# Patient Record
Sex: Female | Born: 1950 | Race: White | Hispanic: No | Marital: Single | Smoking: Never smoker
Health system: Western US, Academic
[De-identification: ages and names within clinical notes are randomized; demographics above are authoritative.]

## PROBLEM LIST (undated history)

## (undated) ENCOUNTER — Encounter (HOSPITAL_BASED_OUTPATIENT_CLINIC_OR_DEPARTMENT_OTHER): Payer: Self-pay

## (undated) ENCOUNTER — Ambulatory Visit (HOSPITAL_BASED_OUTPATIENT_CLINIC_OR_DEPARTMENT_OTHER): Payer: Commercial Managed Care - PPO | Admitting: Gastroenterology

## (undated) DIAGNOSIS — I10 Essential (primary) hypertension: Secondary | ICD-10-CM

## (undated) DIAGNOSIS — K5190 Ulcerative colitis, unspecified, without complications: Principal | ICD-10-CM

## (undated) DIAGNOSIS — F329 Major depressive disorder, single episode, unspecified: Secondary | ICD-10-CM

## (undated) HISTORY — DX: Major depressive disorder, single episode, unspecified: F32.9

## (undated) HISTORY — DX: Ulcerative colitis, unspecified, without complications (CMS-HCC): K51.90

## (undated) HISTORY — DX: Essential (primary) hypertension: I10

## (undated) HISTORY — PX: NO PAST SURGICAL HISTORY: U1045

## (undated) SURGERY — COLONOSCOPY
Anesthesia: Moderate Sedation - by non-anesthesia staff only

---

## 2011-05-19 ENCOUNTER — Encounter (INDEPENDENT_AMBULATORY_CARE_PROVIDER_SITE_OTHER): Payer: Self-pay | Admitting: Gastroenterology

## 2011-05-19 ENCOUNTER — Ambulatory Visit (INDEPENDENT_AMBULATORY_CARE_PROVIDER_SITE_OTHER): Payer: No Typology Code available for payment source | Admitting: Gastroenterology

## 2011-05-19 VITALS — BP 137/78 | HR 80 | Temp 98.8°F | Resp 18 | Ht 64.0 in | Wt 152.0 lb

## 2011-05-19 DIAGNOSIS — F32A Depression, unspecified: Secondary | ICD-10-CM

## 2011-05-19 DIAGNOSIS — I10 Essential (primary) hypertension: Secondary | ICD-10-CM

## 2011-05-19 DIAGNOSIS — K5190 Ulcerative colitis, unspecified, without complications: Secondary | ICD-10-CM

## 2011-05-19 HISTORY — DX: Depression, unspecified: F32.A

## 2011-05-19 HISTORY — DX: Ulcerative colitis, unspecified, without complications (CMS-HCC): K51.90

## 2011-05-19 HISTORY — DX: Essential (primary) hypertension: I10

## 2011-05-19 MED ORDER — MESALAMINE 800 MG PO TBEC
4.0000 | DELAYED_RELEASE_TABLET | Freq: Every day | ORAL | Status: DC
Start: 2011-05-19 — End: 2011-05-19

## 2011-05-19 MED ORDER — MESALAMINE 1000 MG RE SUPP
1000.0000 mg | Freq: Every day | RECTAL | Status: DC
Start: 2011-05-19 — End: 2013-10-09

## 2011-05-19 MED ORDER — MESALAMINE 1000 MG RE SUPP
1000.00 mg | Freq: Every evening | RECTAL | Status: DC
Start: ? — End: 2013-01-09

## 2011-05-19 MED ORDER — VENLAFAXINE HCL 75 MG OR TABS: 75.00 mg | ORAL_TABLET | Freq: Every day | ORAL | Status: AC

## 2011-05-19 MED ORDER — MESALAMINE 800 MG PO TBEC
4.0000 | DELAYED_RELEASE_TABLET | Freq: Every day | ORAL | Status: DC
Start: 2011-05-19 — End: 2012-06-04

## 2011-05-19 MED ORDER — MESALAMINE 1000 MG RE SUPP
1000.0000 mg | Freq: Every day | RECTAL | Status: DC
Start: 2011-05-19 — End: 2011-05-19

## 2011-05-19 MED ORDER — OLMESARTAN MEDOXOMIL 5 MG OR TABS: 5.00 mg | ORAL_TABLET | Freq: Every day | ORAL | Status: AC

## 2011-05-19 MED ORDER — MESALAMINE 800 MG PO TBEC: 800.00 mg | DELAYED_RELEASE_TABLET | ORAL | Status: AC

## 2011-05-19 NOTE — Progress Notes (Signed)
[facility]  New Patient Visit  Visit practitioner:  Estill Bamberg    Date / Time: 05/19/2011 12:12 PM    Referring Provider: No ref. provider found     Reason for Visit: ulcerative colitis    Type of Visit: New patient    History of Present Illness:   Erica Sparks is a 60 year old female see problem list for details.    Patient Active Problem List   Diagnoses Date Noted   . Ulcerative colitis 05/19/2011     1995- diagnosed with ulcerative colitis with bloody diarrhea, dx made on flex sig/colonoscopy, treated with 5ASAs  Imuran 2001-2004  Remicade 2003-May 2010, did well. Stopped because didn't want to continue long term (time consuming, expensive)  May 2010- colonoscopy transverse colon inflammation  2010-2012- did well overall  August 2011- TPMT activity normal  08/16/10 [facility]- was on Asacol 4.8 or 3.6g/d tapered to 2.4g/d and intermittent Canasa. Also was having constipation  April 2012 - colonoscopy - mild proctitis, small polyps in cecum and rectum. Path ??  05/19/11 [facility] - occasional constipation. Typically 0-1 bm/d. Last couple of weeks does feel some incomplete evacuation. No blood. No straining. Gained some weight. Takes Asacol HD 3.2 g/d and Canasa most days. No rashes. Joint pains: L knee. ?Fibromyalgia. No eye problems.     Marland Kitchen HTN (hypertension) 05/19/2011   . Depression 05/19/2011     Not active.          Past Medical History   Diagnosis Date   . Ulcerative colitis 05/19/2011   . HTN (hypertension) 05/19/2011   . Depression 05/19/2011       Past Surgical History   Procedure Date   . No past surgical history        Current Medications:    Mesalamine (ASACOL HD) 800 MG TBEC Take 800 mg by mouth. Four tabs BID   mesalamine (CANASA) 1000 MG suppository Insert 1,000 mg rectally nightly.   olmesartan (BENICAR) 5 MG tablet Take 5 mg by mouth daily.   venlafaxine (EFFEXOR) 75 MG tablet Take 75 mg by mouth daily.       Allergies:  No Known Allergies    Family  History:  Family History   Problem Relation Age of Onset   . GI Neg Hx      no family history of IBD or colon cancer       History     Social History   . Marital Status: Single     Spouse Name: N/A     Number of Children: N/A   . 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 Smoking status: Never Smoker    . Smokeless tobacco: Never Used   . Alcohol Use: Yes      bottle wine per week (spread out 2 glasses per sitting)   . Drug Use: No   . Sexually Active: Not on file     Other Topics Concern   . Not on file     Social History Narrative   . No narrative on file       Review of Systems:  Weight loss:no  Trouble breathing: no  Chest pain: no  Abdominal pain: no  Nausea / vomitting: no  Kidney problems: no  Back pain: no  Seizures: no  Diabetes: no  Thyroid problems: no  Skin rash: no      Physical examination:   BP 137/78  Pulse 80  Temp(Src) 98.8 F (37.1 C) (Oral)  Resp 18  Ht 5\' 4"  (1.626 m)  Wt 68.947 kg (152 lb)  BMI 26.09 kg/m2 Body mass index is 26.09 kg/(m^2).  Wt Readings from Last 2 Encounters:   05/19/11 68.947 kg (152 lb)    Blood Pressure   05/19/11 137/78      Pain Score: 0  General:  Well developed, well nourished in no apparent distress.  Affect:  Normal  HEENT:  No scleral icterus. No red eyes. Trachea midline, mucus membranes moist. No oral ulcers. No cervical lymphadenopathy.  Lungs:  Clear to auscultation bilaterally. Normal respiratory effort.  Cardiovascular:  Regular rate and rhythm without murmurs, rubs or gallops.  Abdomen:  Abdomen soft, non tender, bowel sound present.  Extremities:  No peripheral edema. Warm well-perfused.  Skin:  Non-icteric and no visible rash  Neuro:  Cranial nerves II-XII, sensation, all motor strength grossly intact.  Psych:  Alert oriented X3, No anxiety or depression. Normal affect.    Lab Results  None    Images:  None    Impression: 60 yo woman with ulcerative colitis uncertain extent but  possibly pancolitis previously on Remicade and Imuran now on 5ASAs alone. Last colonoscopy with proctitis and polyps but unknown path. Pt now with incomplete evacuation of bowel movements.    1. Ulcerative colitis (556.9)  FLEX SIG PROCEDURE       Plan:   1) Continue Asacol HD and Canasa for now  2) Trial of Citrucel fiber supplement daily  3) Flexible sigmoidoscopy to assess that proctitis is healed  4) Obtain records from [facility] for colonoscopy from April 2012 along with pathology report  5) Return to clinic after above testing is completed.  6) Needs Cr checked every 6 mos  7) Baseline labs today- CBC with diff, LFT, chemistry, ESR, CRP, 25-OH Vitamin D  8) Surveillance colonoscopy every 1-2 years    Olsalazine, balsalazide, mesalamine toxicity: headache, rash, alopecia, interstitial nephritis, pericarditis, pneumonitis, hepatitis, pancreatitis, worsening diarrhea and abdominal pain.    The patient was discussed with and seen by Dr. [name]  Electronically Signed by:  Estill Bamberg  Gastroenterology   05/19/2011 12:12 PM

## 2011-05-19 NOTE — Patient Instructions (Signed)
1. Take citrucel daily (fiber supplement)  2. Schedule flexible sigmoidoscopy  3. Continue Asacol HD and Canasa suppositories    Side effects of medications you take:    Asacol HD/Canasa toxicity: headache, rash, alopecia, interstitial nephritis, pericarditis, pneumonitis, hepatitis, pancreatitis, worsening diarrhea and abdominal pain.     Support groups: [email or web address] [email or web address] , IBDSF [email or web address]    Dr. Pascal Lux book "IBD Self-Management: The AGA Guide to Crohn's Disease and Ulcerative Colitis" by Dr. Titus Dubin

## 2011-05-19 NOTE — Progress Notes (Signed)
Attending Note:  Cc: ulcerative colitis, constipation    Subjective:  I reviewed the history.  Patient interviewed and examined.  Pertinent notes from History of present illness (HPI) include  HPI:   Extensive uc since 1995. HPI noted as per problem list today.  Now, some feeling of incomplete evacuation. No bleeding. No diarrhea. Some joint aches, (knee, elbow, rib)  Recent blood tests at [facility] , normal cbc, creatinine. Vitamin d being monitored. Has osteopenia, being monitored.  Had flu shot this year.    Review of Systems (ROS): As per the fellow's note.  Past Medical, Family, Social History:  As per the fellow's  note.    Objective:   I have examined the patient and I concur with the fellow's exam.  Abnormalities on exam:   NAD  Lymph: no cervical LAD  Ab: soft, nt/nd  Assessment and plan reviewed with the fellow. I agree with the fellow's plan as documented.    Impression/Report/Plan:    Impression: :   1. Ulcerative colitis, (extensive) ,  Treated with 3.2 g/day 5asa + nightly 5-asa suppository; creatinine normal (per hx)  - recent symptoms of constipation may be functional vs due to active procitis  - rule out inflammation and check for healing w/ flexible sigmoidoscopy. If active, increase 5-asa. If severe/non response in future , could consider humira  2. History of polyps at last colonoscopy: obtain outside records from pathology at [facility]  - repeat surveillaince colonoscopy between about may 2013 and April 2014; (1-2 years post last colonoscopy), sooner pending biopsy results  3. Health care maintenance: up to date on flu shot, vitamin d and osteopenia being monitored    Risks of therapy discussed included interstial nephritis, and low blood counts    Recommendations:  1. Continue asacol 3.2g/day and canasa daily  2. Flexible sigmoidoscopy  3. Surveillance colonoscopy 1-2 years post last colonoscopy, sooner pending path report  4. Obtain outside records (creatinine, colonoscopy)    See the  fellow's note for further details.    Shayden Gingrich G. Jameia Makris MD  Division of Gastroenterology     counseling (preventative care, parodoxycal constipation of proctititis, 5-asa options and risks)  (25 minutes counseling)

## 2011-05-20 ENCOUNTER — Encounter (INDEPENDENT_AMBULATORY_CARE_PROVIDER_SITE_OTHER): Payer: Self-pay | Admitting: Gastroenterology

## 2011-05-20 NOTE — Progress Notes (Signed)
Encounter opened to scan documents.

## 2011-05-24 ENCOUNTER — Encounter (INDEPENDENT_AMBULATORY_CARE_PROVIDER_SITE_OTHER): Payer: Self-pay | Admitting: Gastroenterology

## 2012-06-04 ENCOUNTER — Other Ambulatory Visit (INDEPENDENT_AMBULATORY_CARE_PROVIDER_SITE_OTHER): Payer: Self-pay | Admitting: Gastroenterology

## 2012-06-04 MED ORDER — MESALAMINE 800 MG PO TBEC
3200.0000 mg | DELAYED_RELEASE_TABLET | Freq: Every day | ORAL | Status: DC
Start: 2012-06-04 — End: 2012-07-16

## 2012-06-04 NOTE — Telephone Encounter (Signed)
Needs creatinine and cbc performed to refill beyond 1 time.     Please ask her to schedule followup visit and flexible sigmoidoscopy which have been ordered in past and obtain labs.

## 2012-06-04 NOTE — Telephone Encounter (Signed)
Patient returning nurse call

## 2012-06-04 NOTE — Telephone Encounter (Signed)
Received refill from [facility] for patients asacol HD.     Last seen 05/19/11  Recommendations:   1. Continue asacol 3.2g/day and canasa daily   2. Flexible sigmoidoscopy   3. Surveillance colonoscopy 1-2 years post last colonoscopy, sooner pending path report    Patient did not get flex sig here at [place].

## 2012-06-04 NOTE — Telephone Encounter (Signed)
Called pt to notify her that med was refilled x1 and that she needs labs done prior to subsequent refills.   Will have schedulers contact pt for clinic visit and procedure scheduling.     Message was left for pt.

## 2012-06-06 NOTE — Telephone Encounter (Signed)
Called pt.   When she calls back, please schedule her for a clinic visit and flex sig.   Please also let her know that she needs blood work done before her medications can be refilled again (this was filled x1).     Left detailed message for pt.

## 2012-06-15 ENCOUNTER — Telehealth (INDEPENDENT_AMBULATORY_CARE_PROVIDER_SITE_OTHER): Payer: Self-pay | Admitting: Gastroenterology

## 2012-06-15 ENCOUNTER — Other Ambulatory Visit: Payer: PPO | Attending: Gastroenterology

## 2012-06-15 DIAGNOSIS — K5190 Ulcerative colitis, unspecified, without complications: Secondary | ICD-10-CM | POA: Insufficient documentation

## 2012-06-15 LAB — CBC WITH DIFF, BLOOD
ANC-Automated: 3.4 10*3/uL (ref 1.6–7.0)
Abs Basophils: 0.1 10*3/uL (ref 0.0–0.1)
Abs Eosinophils: 0.1 10*3/uL (ref 0.0–0.5)
Abs Lymphs: 2 10*3/uL (ref 0.8–3.1)
Abs Monos: 0.6 10*3/uL (ref 0.2–0.8)
Basophils: 1 % (ref 0–2)
Eosinophils: 1 % (ref 1–7)
Hct: 38 % (ref 34.0–45.0)
Hgb: 12.8 gm/dL (ref 11.2–15.7)
Lymphocytes: 33 % (ref 19–53)
MCH: 30.2 pg (ref 26.0–32.0)
MCHC: 33.7 % (ref 32.0–36.0)
MCV: 89.6 um3 (ref 79.0–95.0)
MPV: 10.5 fL (ref 9.4–12.4)
Monocytes: 9 % (ref 5–12)
Plt Count: 213 10*3/uL (ref 140–370)
RBC: 4.24 10*6/uL (ref 3.90–5.20)
RDW: 12.6 % (ref 12.0–14.0)
Segs: 56 % (ref 34–71)
WBC: 6.1 10*3/uL (ref 4.0–10.0)

## 2012-06-15 LAB — CREATININE W/GFR: Creatinine: 0.62 mg/dL (ref 0.51–0.95)

## 2012-06-15 LAB — GFR: GFR: >60 mL/min

## 2012-06-15 NOTE — Telephone Encounter (Signed)
Called patient to notify that no PA forms have been received from pharmacy. Last refill was 12/16.  Left VM for patient to return my call.   If I am unavailable please get information form patient.   I need to know ID number  Phone number where I need to call.     Thank you.

## 2012-06-15 NOTE — Telephone Encounter (Signed)
The patient would like a call back with the status of the authorization for her Asacol medication. The patient is very anxious, please call her before the end of the day.

## 2012-06-27 NOTE — Telephone Encounter (Signed)
Called patient to follow up.  She has received her medication, it was a mess up the pharmacy part.     Patient also inquired about her labs that were recently done.   Confirmed with Erica Sparks that labs are normal and relayed information to patient.

## 2012-07-16 ENCOUNTER — Ambulatory Visit (INDEPENDENT_AMBULATORY_CARE_PROVIDER_SITE_OTHER): Payer: PPO | Admitting: Gastroenterology

## 2012-07-16 VITALS — BP 125/80 | HR 81 | Temp 98.0°F | Resp 16 | Ht 64.0 in | Wt 152.0 lb

## 2012-07-16 DIAGNOSIS — K5190 Ulcerative colitis, unspecified, without complications: Secondary | ICD-10-CM

## 2012-07-16 MED ORDER — MESALAMINE 800 MG PO TBEC
3200.0000 mg | DELAYED_RELEASE_TABLET | Freq: Every day | ORAL | Status: DC
Start: 2012-07-16 — End: 2013-01-09

## 2012-07-16 MED ORDER — SOD PICOSULFATE-MAG OX-CIT ACD 10-3.5-12 MG-GM-GM PO PACK
1.00 | PACK | Freq: Once | ORAL | Status: AC
Start: 2012-07-16 — End: 2012-07-16

## 2012-07-16 NOTE — Progress Notes (Signed)
[facility]    Visit practitioner:  Aliah Eriksson G. [name]    Date / Time: 07/16/2012 8:14 AM    Referring Provider: Self, Referred     Reason for Visit/Chief Complaint: ulcerative colitis     Type of Visit: Follow up    History of Present Illness:   Erica Sparks is a 62 year old female With ulcerative colitis  since 1995    Currently taking:    mesalamine (ASACOL HD) 800 MG TBEC EC tablet Take 4 tablets by mouth daily.   Mesalamine (ASACOL HD) 800 MG TBEC Take 800 mg by mouth. Four tabs BID   mesalamine (CANASA) 1000 MG suppository Insert 1,000 mg rectally nightly.   mesalamine (CANASA) 1000 MG suppository Insert 1 suppository rectally daily.   olmesartan (BENICAR) 5 MG tablet Take 5 mg by mouth daily.   venlafaxine (EFFEXOR) 75 MG tablet Take 75 mg by mouth daily.         Bowel movements per day, 0/1 blood:  no   Vomitting: no  NSAID use: no  Antibiotics:  no  Fever: no  Weight loss: stable ,   ,   Sub par energy       No extraintestinal manifestations of Inflammatory bowel disease    Patient Active Problem List    Diagnosis Date Noted   . Ulcerative colitis 05/19/2011     1995- diagnosed with ulcerative colitis with bloody diarrhea, dx made on flex sig/colonoscopy, treated with 5ASAs  Imuran 2001-2004  Remicade 2003-May 2010, did well. Stopped because didn't want to continue long term (time consuming, expensive)  May 2010- colonoscopy transverse colon inflammation  2010-2012- did well overall  August 2011- TPMT activity normal  08/16/10 [facility]- was on Asacol 4.8 or 3.6g/d tapered to 2.4g/d and intermittent Canasa. Also was having constipation  April 2012 - colonoscopy - mild proctitis, small polyps in cecum and rectum. Path ??  05/19/11 [facility] Penta [place] [facility] - occasional constipation. Typically 0-1 bm/d. Last couple of weeks does feel some incomplete evacuation. No blood. No straining. Gained some weight. Takes Asacol HD 3.2 g/d and Canasa most days. No rashes. Joint pains: L  knee. ?Fibromyalgia. No eye problems.  1/27/14[name] IBD clinic: asacolHD  3.2g/day, intermittent suppositories. At baseline, 0-1 BM/day, creatinine and cbc normal. Following calcium, vitamin D with PCP, yearly flu shot. Plan: surveillance colonoscopy in May/June, and follow up in November. , continue current medications     . HTN (hypertension) 05/19/2011   . Depression 05/19/2011     Not active.          Past Medical History   Diagnosis Date   . Ulcerative colitis 05/19/2011   . HTN (hypertension) 05/19/2011   . Depression 05/19/2011       Past Surgical History   Procedure Laterality Date   . No past surgical history             Current Medications:    mesalamine (ASACOL HD) 800 MG TBEC EC tablet Take 4 tablets by mouth daily.   Mesalamine (ASACOL HD) 800 MG TBEC Take 800 mg by mouth. Four tabs BID   mesalamine (CANASA) 1000 MG suppository Insert 1,000 mg rectally nightly.   mesalamine (CANASA) 1000 MG suppository Insert 1 suppository rectally daily.   olmesartan (BENICAR) 5 MG tablet Take 5 mg by mouth daily.   venlafaxine (EFFEXOR) 75 MG tablet Take 75 mg by mouth daily.     asacol 4 pills daily (3.2g) asacol  HD  Allergies:  No Known Allergies    Family History:  Family History   Problem Relation Age of Onset   . GI Neg Hx      no family history of IBD or colon cancer       History     Social History   . Marital Status: Single     Spouse Name: N/A     Number of Children: N/A   . 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 Smoking status: Never Smoker    . Smokeless tobacco: Never Used   . Alcohol Use: Yes      bottle wine per week (spread out 2 glasses per sitting)   . Drug Use: No   . Sexually Active: Not on file     Other Topics Concern   . Not on file     Social History Narrative   . No narrative on file       Review of Systems:   No other new medical problems        Physical examination:   BP 125/80  Pulse 81  Temp(Src) 98 F (36.7  C) (Oral)  Resp 16  Ht 5\' 4"  (1.626 m)  Wt 68.947 kg (152 lb)  BMI 26.08 kg/m2 Body mass index is 26.08 kg/(m^2).  Wt Readings from Last 2 Encounters:   07/16/12 68.947 kg (152 lb)   05/19/11 68.947 kg (152 lb)    Blood Pressure   07/16/12 125/80   05/19/11 137/78      Pain Score: 0     General:  Well developed, well nourished in no apparent distress.  HEENT:  No scleral icterus. No red eyes.  mucus membranes moist.   No oral ulcers. No cervical lymphadenopathy.  Lungs:  Clear to auscultation and percussion bilaterally. Normal respiratory effort  Cardiovascular:  Regular rate and rhythm without murmurs, rubs or gallops.  Abdomen:  Abdomen soft, non tender, bowel sound present. No masses.  Extremities:  No peripheral edema. Warm well-perfused.  Skin:  Non-jaundiced and no visible rash  Neuro:  Alert and oriented x 3, moving all 4 extremities, normal gait  Psych:   Normal affect.      Lab Results  Lab Results   Component Value Date    WBC 6.1 06/15/2012    RBC 4.24 06/15/2012    HGB 12.8 06/15/2012    HCT 38.0 06/15/2012    MCV 89.6 06/15/2012    MCHC 33.7 06/15/2012    RDW 12.6 06/15/2012    PLT 213 06/15/2012    MPV 10.5 06/15/2012     Lab Results   Component Value Date    CREAT 0.62 06/15/2012       Images:  No images       Impression/Report/Plan:   1. Ulcerative colitis(19 years), extensive colitis in past; recently proctitis  - in clinical remission on oral + topical 5-asa  - continue current medications  - creatinine q 6 months, goal lft, cbc yearly    2. Surveillance; history of serrated adenoma in unaffected right colon on last colonoscopy  - colonoscopy in may /june this year    3. HCM- yearly flu shot, calcium, vitamin d supplementation       Risks of therapy discussed including interstitial nephritis  , as well as need for frequent laboratory monitoring  Recommendations:   1. Continue asacol HD 4 pills daily, and as needed suppositories  2. Check creatinine every 6 months, and yearly lft, cbc.  3.  Colonoscopy for surveillance in May or June, (consider picoprep or suprep)   4. Follow up in clinic with Dr [name] in November or earlier if new symptoms            Electronically Signed by:  Philmore Pali. [facility]  Gastroenterology   07/16/2012 8:14 AM

## 2012-07-16 NOTE — Patient Instructions (Addendum)
1. Continue asacol HD 4 pills daily, and as needed suppositories  2. Check creatinine every 6 months, and yearly lft, cbc.  3. Colonoscopy for surveillance in May or June, (consider prepopik or suprep)   4. Follow up in clinic with Dr [name] in November or earlier if new symptoms

## 2012-11-20 ENCOUNTER — Telehealth (INDEPENDENT_AMBULATORY_CARE_PROVIDER_SITE_OTHER): Payer: Self-pay | Admitting: Gastroenterology

## 2012-11-20 ENCOUNTER — Encounter (INDEPENDENT_AMBULATORY_CARE_PROVIDER_SITE_OTHER): Payer: Self-pay | Admitting: Gastroenterology

## 2012-11-20 NOTE — Telephone Encounter (Signed)
Patient has the bowel prep

## 2012-11-20 NOTE — Telephone Encounter (Signed)
Dear Erica Sparks,    This email is to confirm that we have you scheduled for a procedure at [facility], Monday, December 17, 2012 at 1:30 pm.  Please check in 1 hour prior.  [facility] is located at [address], [place]. [address] Instructions & [place] [employer] map attached.      We also have you schedule for a follow up consultation with Dr. [name] on Wednesday, January 09, 2013 at 8:00 am.  Please check in 15 minutes early.  We are located in the [facility]. at [address], [place]. [address].

## 2012-12-17 ENCOUNTER — Encounter (HOSPITAL_BASED_OUTPATIENT_CLINIC_OR_DEPARTMENT_OTHER): Payer: Self-pay | Admitting: Gastroenterology

## 2012-12-17 ENCOUNTER — Telehealth (INDEPENDENT_AMBULATORY_CARE_PROVIDER_SITE_OTHER): Payer: Self-pay | Admitting: Gastroenterology

## 2012-12-17 ENCOUNTER — Encounter (HOSPITAL_BASED_OUTPATIENT_CLINIC_OR_DEPARTMENT_OTHER): Admission: RE | Disposition: A | Discharge status: Home Health | Payer: Self-pay | Attending: Gastroenterology

## 2012-12-17 ENCOUNTER — Ambulatory Visit (HOSPITAL_BASED_OUTPATIENT_CLINIC_OR_DEPARTMENT_OTHER): Payer: PPO | Admitting: Gastroenterology

## 2012-12-17 ENCOUNTER — Ambulatory Visit
Admission: RE | Admit: 2012-12-17 | Discharge: 2012-12-17 | Disposition: A | Discharge status: Home / Self Care | Payer: PPO | Source: Ambulatory Visit | Attending: Gastroenterology | Admitting: Gastroenterology

## 2012-12-17 DIAGNOSIS — K5190 Ulcerative colitis, unspecified, without complications: Secondary | ICD-10-CM

## 2012-12-17 DIAGNOSIS — Z1211 Encounter for screening for malignant neoplasm of colon: Secondary | ICD-10-CM | POA: Insufficient documentation

## 2012-12-17 HISTORY — PX: COLONOSCOPY PROCEDURE: GI45378

## 2012-12-17 SURGERY — COLONOSCOPY
Anesthesia: Conscious Sedation

## 2012-12-17 MED ORDER — FENTANYL CITRATE 0.05 MG/ML IJ SOLN
INTRAMUSCULAR | Status: DC | PRN
Start: 2012-12-17 — End: 2012-12-17
  Administered 2012-12-17 (×2): 25 ug via INTRAVENOUS
  Administered 2012-12-17: 50 ug via INTRAVENOUS
  Administered 2012-12-17 (×2): 25 ug via INTRAVENOUS

## 2012-12-17 MED ORDER — SODIUM CHLORIDE 0.9 % IV SOLN
INTRAVENOUS | Status: DC | PRN
Start: 2012-12-17 — End: 2012-12-17
  Administered 2012-12-17: 125 mL/h via INTRAVENOUS

## 2012-12-17 MED ORDER — MIDAZOLAM HCL 5 MG/5ML IJ SOLN
INTRAMUSCULAR | Status: DC | PRN
Start: 2012-12-17 — End: 2012-12-17
  Administered 2012-12-17: 1 mg via INTRAVENOUS
  Administered 2012-12-17: 2 mg via INTRAVENOUS
  Administered 2012-12-17: 1 mg via INTRAVENOUS
  Administered 2012-12-17: 2 mg via INTRAVENOUS
  Administered 2012-12-17 (×2): 1 mg via INTRAVENOUS

## 2012-12-17 SURGICAL SUPPLY — 1 items: FORCEP BIOPSY RADIAL JAW 3 MAX (W NEEDLE) (Misc Medical Supply) ×2 IMPLANT

## 2012-12-17 NOTE — Interdisciplinary (Signed)
Verified and reviewed discharge instructions with patient and ride, [facility].

## 2012-12-17 NOTE — Telephone Encounter (Signed)
Assess creatinine every 6 months.Marland Kitchen

## 2012-12-17 NOTE — H&P (Signed)
History and Physical    Indication for procedure: Ulcerative colitis, surveillance )    Pain Score: 0          Past Medical History   Diagnosis Date   . Ulcerative colitis 05/19/2011   . HTN (hypertension) 05/19/2011   . Depression 05/19/2011     Past Surgical History   Procedure Laterality Date   . No past surgical history       No Known Allergies  Prior to Admission Medications   Outpatient Medications Last Dose Informant Patient Reported? Taking?   Mesalamine (ASACOL HD) 800 MG TBEC 12/16/2012 at Unknown time  Yes Yes   Sig: Take 800 mg by mouth. Four tabs BID   mesalamine (ASACOL HD) 800 MG TBEC EC tablet   No No   Sig: Take 4 tablets by mouth daily.   mesalamine (CANASA) 1000 MG suppository Past Week at Unknown time  Yes Yes   Sig: Insert 1,000 mg rectally nightly.   mesalamine (CANASA) 1000 MG suppository   No No   Sig: Insert 1 suppository rectally daily.   olmesartan (BENICAR) 5 MG tablet 12/16/2012 at Unknown time  Yes Yes   Sig: Take 5 mg by mouth daily.   venlafaxine (EFFEXOR) 75 MG tablet   Yes No   Sig: Take 75 mg by mouth daily.      Facility-Administered Medications: None       BP 133/64  Pulse 70  Temp(Src) 98 F (36.7 C)  Resp 18  Ht 5\' 4"  (1.626 m)  Wt 67.132 kg (148 lb)  BMI 25.39 kg/m2  SpO2 100%  General:   Normal  Lungs:   Normal  CV:    Normal  Abdomen:   Normal    ASA Score:  2  Airway (Mallimpati) Score:  Class I - Soft palate, uvula, fauces, and pillars are visible.    Assessment and Plan  Proceed to planned procedure.    The patient has consented to the procedure, which will be done with sedation.  I have assessed the patient's status immediately prior to this procedure.  I have discussed pain management needs and options for the patient with the patient or caregiver.      The patient agrees to be full code for the duration of the procedure.    Sedation options, risks, and plans have been discussed with the patient or caregiver.  Questions were answered.  The patient or caregiver  agrees to proceed as planned.    Erica Sparks

## 2012-12-17 NOTE — Procedures (Signed)
Report Author:  Minerva Areola, M.D.    Date of Operation:  12/17/2012    Endoscopist: Virgel Paling    GI Fellow: None    Referring Physician:    PROCEDURE PERFORMED: colonoscopy    INDICATIONS FOR EXAMINATION: ulcerative colitis,  surveillance and disease activity assessment    INSTRUMENTS:    MEDICATIONS: versed  8 mg , fentanyl 150 mcg iv  NEED FOR ANESTHESIA:Moderate sedation    The attending physician, Dr.Shavone Nevers [name], was present  for the entire examination.    PROCEDURE TECHNIQUE: A physical exam was  performed. Informed consent was obtained from the patient  after explaining all the risks (perforation, bleeding, infection,  missed lesion(s), and adverse effects to the medicine),  benefits and alternatives to the procedure which the patient  appeared to understand and so stated.  The patient was  connected to the monitoring devices and placed in the left  lateral position. Continuous oxygen was provided with a  nasal cannula and IV medicine administered through an  indwelling cannula. After adequate moderate sedation was  achieved, a digital exam was performed and the  colonoscope was introduced into the rectum and advanced  under direct visualization to the extent of exam The scope  was subsequently removed slowly while carefully examining  the color, texture, anatomy, and integrity of the mucosa on  the way out. In the rectum, the scope was retroflexed to  evaluate for internal hemorrhoids and anorectal pathology.  The patient was subsequently transferred to the recovery  area in satisfactory condition.      COMPLICATIONS: None  ESTIMATED BLOOD LOSS: None  BIOPSY TAKEN: Yes  BOWEL PREP QUALITY:  EXTENT OF EXAM: cecum    Findings: 1 cecum/ascending: normal appearing mucosa , 8  biopsies for surveillance (jar A)  2. transverse: normal appearing mucosa , 8 biopsies for  surveillance (jar A)  3 descending: normal appearing mucosa , 8 biopsies for  surveillance (jar A)  4 sigmoid/rectum:  normal  appearing mucosa except for mild  erythema, 9 biopsies for surveillance (jar A)    Endoscopic Diagnosis: ulcerative colitis , in endoscopic  remission.  surveillance biopsies performed    Recommendations: 1. continue current medications  2. surveillance colonoscopy between every 1 and 2 years.  3. follow up biopsies.  4 check cbc and creatinine every 6 months            Electronically signed by:  Minerva Areola, M.D. 12/18/2012 10:43 A          DD: 12/17/2012    DT:  12/17/2012 01:30 P  DocNo.:  [id number]  BL/bsm    Referring Physician:  [name]          cc:

## 2012-12-19 ENCOUNTER — Encounter (HOSPITAL_BASED_OUTPATIENT_CLINIC_OR_DEPARTMENT_OTHER): Payer: Self-pay | Admitting: Gastroenterology

## 2012-12-20 NOTE — Procedures (Signed)
FINAL PATHOLOGIC DIAGNOSIS:  A: Cecum and ascending colon, biopsy       -Colonic mucosa with no diagnostic alteration.       -No granulomas, viral cytopathologic change, or dysplasia identified.  B: Transverse colon, biopsy       -Colonic mucosa with no diagnostic alteration.       -No granulomas, viral cytopathologic effect, or dysplasia identified.  C: Descending colon, biopsy       -Colonic mucosa with minimal architectural changes.       -No granulomas, viral cytopathologic effect, or dysplasia identified.  D: Rectosigmoid colon, biopsy       -Colonic mucosa with minimal architectural changes.       -No granulomas, viral cytopathologic effect, or dysplasia identified.    SPECIMEN(S) SUBMITTED:  A: Cecum ascending colon bx  B: Transverse colon bx  C: Descending colon bx  D: Rectosigmoid colon bx    CLINICAL HISTORY:  Ulcerative colitis.    GROSS DESCRIPTION:  A: The specimen (received in formalin, labeled with the patient's name,  medical record number, and "cecum ascending colon bx") consists of seven  tan soft tissue fragments ranging in size from 0.4 to 0.5 cm.  The entire  specimen is submitted in cassette A1.  B: The specimen (received in formalin, labeled with the patient's name,  medical record number, and "transverse colon bx") consists of seven red-tan  soft tissue fragments ranging in size from 0.4 to 0.8 cm.  The entire  specimen is submitted in cassette B1.  C: The specimen (received in formalin, labeled with the patient's name,  medical record number, and "descending colon bx") consists of seven red-tan  soft tissue fragments ranging in size from 0.3 up to 0.7 cm in greatest  dimension.  The entire specimen is submitted in cassette C1.  D: The specimen (received in formalin, labeled with the patient's name,  medical record number, and "rectosigmoid colon bx") consists of seven  red-tan soft tissue fragments ranging in size from 0.3 to 0.7 cm in  greatest dimension.  The entire specimen is submitted  in cassette D1.  MW/VK/dlp  CONFIDENTIAL HEALTH INFORMATION: Health Care information is personal and  sensitive information. If it is being faxed to you it is done so under  appropriate authorization from the patient or under circumstances that do  not require patient authorization. You, the recipient, are obligated to  maintain it in a safe, secure and confidential manner. Re-disclosure  without additional patient consent or as permitted by law is prohibited.  Unauthorized re-disclosure or failure to maintain confidentiality could  subject you to penalties described in federal and state law.  If you have  received this report or facsimile in error, please notify the [facility] immediately and destroy the received document(s).    Material reviewed and Interpreted and  Report Electronically Signed by:  Renae Fickle M.D., Ph.D. [phone])  Attending Surgical Pathologist  12/20/12 09:59  Electronic Signature derived from a single  controlled access password

## 2012-12-24 ENCOUNTER — Telehealth (INDEPENDENT_AMBULATORY_CARE_PROVIDER_SITE_OTHER): Payer: Self-pay | Admitting: Gastroenterology

## 2012-12-24 NOTE — Telephone Encounter (Signed)
I called pt.   Results were discussed.   She has a follow up visit on July 23rd with Dr. [name].

## 2012-12-24 NOTE — Telephone Encounter (Signed)
Message copied by Gentry Roch on Mon Dec 24, 2012 11:56 AM  ------       Message from: Virgel Paling       Created: Thu Dec 20, 2012 11:29 AM         No dysplasia, followup within 6 months  ------

## 2013-01-09 ENCOUNTER — Encounter (INDEPENDENT_AMBULATORY_CARE_PROVIDER_SITE_OTHER): Payer: Self-pay | Admitting: Gastroenterology

## 2013-01-09 ENCOUNTER — Ambulatory Visit (INDEPENDENT_AMBULATORY_CARE_PROVIDER_SITE_OTHER): Payer: PPO | Admitting: Gastroenterology

## 2013-01-09 VITALS — BP 125/78 | HR 74 | Temp 98.0°F | Resp 20 | Ht 64.0 in | Wt 147.0 lb

## 2013-01-09 DIAGNOSIS — K5190 Ulcerative colitis, unspecified, without complications: Secondary | ICD-10-CM

## 2013-01-09 MED ORDER — MESALAMINE 1000 MG RE SUPP
1000.0000 mg | Freq: Every evening | RECTAL | Status: DC
Start: 2013-01-09 — End: 2013-10-09

## 2013-01-09 MED ORDER — VITAMIN D 1000 UNIT OR TABS: 1000.00 [IU] | ORAL_TABLET | Freq: Every day | ORAL | Status: AC

## 2013-01-09 MED ORDER — MESALAMINE 800 MG PO TBEC
3200.0000 mg | DELAYED_RELEASE_TABLET | Freq: Every day | ORAL | Status: DC
Start: 2013-01-09 — End: 2013-06-28

## 2013-01-09 MED ORDER — MULTI-VITAMIN OR TABS: 1.00 | ORAL_TABLET | Freq: Every day | ORAL | Status: AC

## 2013-01-09 NOTE — Patient Instructions (Signed)
1. Continue asacol HD 3.2 grams per day  2. canasa suppositories as needed  3. Obtain your recent lab tests from you doctor and ask to be sent here  4. Creatinine test in the next 2 months if not done, and then 2x /year  5. Follow up in 6 months  6. Colonoscopy for surveillance in 2 years.

## 2013-01-09 NOTE — Progress Notes (Signed)
[facility]    Visit practitioner:  Erica Sparks    Date / Time: 01/09/2013 8:24 AM    Referring Provider: Self, Referred     Reason for Visit/Chief Complaint: ulcerative colitis    Type of Visit: Follow up    History of Present Illness:   Erica Sparks is a 62 year old female  With ulcerative colitis since 1995      Currently taking:    cholecalciferol (VITAMIN D) 1000 UNIT tablet Take 1,000 Units by mouth daily.   mesalamine (ASACOL HD) 800 MG TBEC EC tablet Take 4 tablets by mouth daily.   Mesalamine (ASACOL HD) 800 MG TBEC Take 800 mg by mouth. Four tabs BID   mesalamine (CANASA) 1000 MG suppository Insert 1,000 mg rectally nightly.   mesalamine (CANASA) 1000 MG suppository Insert 1 suppository rectally daily.   multivitamin (MULTI-VITAMIN) tablet Take 1 tablet by mouth daily.   olmesartan (BENICAR) 5 MG tablet Take 5 mg by mouth daily.   venlafaxine (EFFEXOR) 75 MG tablet Take 75 mg by mouth daily.         Bowel movements per day, 0-1 blood:  no   Vomitting: no  NSAID use: no  Antibiotics:  no  Fever: no  Weight loss: no  ,   Low energy: sub par       No extraintestinal manifestations of Inflammatory bowel disease    Patient Active Problem List    Diagnosis Date Noted   . Ulcerative colitis 05/19/2011     1995- diagnosed with ulcerative colitis with bloody diarrhea, dx made on flex sig/colonoscopy, treated with 5ASAs  Imuran 2001-2004  Remicade 2003-May 2010, did well. Stopped because didn't want to continue long term (time consuming, expensive)  May 2010- colonoscopy transverse colon inflammation  2010-2012- did well overall  August 2011- TPMT activity normal  08/16/10 Erica Sparks [facility]- was on Asacol 4.8 or 3.6g/d tapered to 2.4g/d and intermittent Canasa. Also was having constipation  April 2012 - colonoscopy - mild proctitis, small polyps in cecum and rectum. Path ??  05/19/11 Erica [facility]ks [facility] clinic - occasional constipation. Typically 0-1 bm/d. Last couple of weeks does feel  some incomplete evacuation. No blood. No straining. Gained some weight. Takes Asacol HD 3.2 g/d and Canasa most days. No rashes. Joint pains: L knee. ?Fibromyalgia. No eye problems.  1/27/14Mliss Sparks IBD clinic: asacolHD  3.2g/day, intermittent suppositories. At baseline, 0-1 BM/day, creatinine and cbc normal. Following calcium, vitamin D with PCP, yearly flu shot. Plan: surveillance colonoscopy in May/June, and follow up in November. , continue current medications  01-09-13: Erica Sparks ibd clinic : asacol 3.2g, canasa 3x /week. bm 0-1/day, no blood;      . HTN (hypertension) 05/19/2011   . Depression 05/19/2011     Not active.          Past Medical History   Diagnosis Date   . Ulcerative colitis 05/19/2011   . HTN (hypertension) 05/19/2011   . Depression 05/19/2011       Past Surgical History   Procedure Laterality Date   . No past surgical history     . Colonoscopy procedure  12/17/2012     Procedure: GI COLONOSCOPY;  Surgeon: Erica Sparks., MD;  Location: [facility];  Service: Gastroenterology;;           Current Medications:    cholecalciferol (VITAMIN D) 1000 UNIT tablet Take 1,000 Units by mouth daily.   mesalamine (ASACOL HD) 800 MG TBEC EC  tablet Take 4 tablets by mouth daily.   Mesalamine (ASACOL HD) 800 MG TBEC Take 800 mg by mouth. Four tabs BID   mesalamine (CANASA) 1000 MG suppository Insert 1,000 mg rectally nightly.   mesalamine (CANASA) 1000 MG suppository Insert 1 suppository rectally daily.   multivitamin (MULTI-VITAMIN) tablet Take 1 tablet by mouth daily.   olmesartan (BENICAR) 5 MG tablet Take 5 mg by mouth daily.   venlafaxine (EFFEXOR) 75 MG tablet Take 75 mg by mouth daily.       Allergies:  No Known Allergies    Family History:  Family History   Problem Relation Age of Onset   . Gastrointestinal Neg Hx      no family history of IBD or colon cancer       History     Social History   . Marital Status: Single     Spouse Name: N/A     Number of Children: N/A   . Years of  Education: N/A     Occupational History   . Marketing      [employer]     Social History Main Topics   . Smoking status: Never Smoker    . Smokeless tobacco: Never Used   . Alcohol Use: Yes      Comment: bottle wine per week (spread out 2 glasses per sitting)   . Drug Use: No   . Sexually Active: Not on file     Other Topics Concern   . Not on file     Social History Narrative   . No narrative on file       Review of Systems:   No fever, no vomiting, no extraintestinal manifestations of IBD      Physical examination:   BP 125/78  Pulse 74  Temp(Src) 98 F (36.7 C) (Oral)  Resp 20  Ht 5\' 4"  (1.626 m)  Wt 66.679 kg (147 lb)  BMI 25.22 kg/m2 Body mass index is 25.22 kg/(m^2).  Wt Readings from Last 2 Encounters:   01/09/13 66.679 kg (147 lb)   12/17/12 67.132 kg (148 lb)    Blood Pressure   01/09/13 125/78   12/17/12 118/55      Pain Score: 0    General:  Well developed, well nourished in no apparent distress.    Lab Results  Lab Results   Component Value Date    WBC 6.1 06/15/2012    RBC 4.24 06/15/2012    HGB 12.8 06/15/2012    HCT 38.0 06/15/2012    MCV 89.6 06/15/2012    MCHC 33.7 06/15/2012    RDW 12.6 06/15/2012    PLT 213 06/15/2012    MPV 10.5 06/15/2012     Lab Results   Component Value Date    CREAT 0.62 06/15/2012     No results found for this basename: AST, ALT, GGT, LDH, ALK, TP, ALB, TBILI, DBILI       Images:  Reviewed colonoscopy and pathology findings (remission)         Impression/Report/Plan:    1. Ulcerative colitis(19 years), extensive colitis in past; in endoscopic and histologic remission on 5-asa  - continue current medications   - creatinine q 6 months, goal lft, cbc yearly  - colonoscopy in 2 years  - obtain recent lab tests today from your primary doctor; otherwise if no creatinine, check within the next 8 weeks        Recommendations:   1. Continue asacol HD 3.2 grams per day (ordered)  2. canasa suppositories as needed (ordered)   3. Obtain your recent lab tests from you doctor and  ask to be sent here  4. Creatinine test in the next 2 months if not done, and then 2x /year  5. Follow up in 6 months  6. Colonoscopy for surveillance in 2 years.             Total time 10 Minutes in counseling of a 20 minute visit.    Electronically Signed by:  Philmore Pali [facility]  Gastroenterology   01/09/2013 8:24 AM

## 2013-03-07 ENCOUNTER — Telehealth (INDEPENDENT_AMBULATORY_CARE_PROVIDER_SITE_OTHER): Payer: Self-pay | Admitting: Gastroenterology

## 2013-03-07 NOTE — Telephone Encounter (Signed)
 Received Urinalysis/Lipid Panel/CBC Lab Results 11/28/2012 from [facility] scanned in epic for review.

## 2013-06-28 ENCOUNTER — Other Ambulatory Visit (INDEPENDENT_AMBULATORY_CARE_PROVIDER_SITE_OTHER): Payer: Self-pay | Admitting: Gastroenterology

## 2013-06-28 DIAGNOSIS — K5190 Ulcerative colitis, unspecified, without complications: Principal | ICD-10-CM

## 2013-06-28 NOTE — Telephone Encounter (Signed)
Pended order for Asacol to be reviewed by provider.

## 2013-07-01 MED ORDER — MESALAMINE 800 MG PO TBEC
3200.0000 mg | DELAYED_RELEASE_TABLET | Freq: Every day | ORAL | Status: DC
Start: 2013-06-28 — End: 2013-10-09

## 2013-07-01 NOTE — Telephone Encounter (Signed)
Patient is concerned because her insurance needs a prior auth for the medication Asacol.  Please contact [employer] or submit a prior auth to [id number].  Thank you.

## 2013-07-01 NOTE — Telephone Encounter (Signed)
Refilled. Needs to come in to have labs done.

## 2013-07-02 ENCOUNTER — Other Ambulatory Visit: Payer: Commercial Managed Care - PPO | Attending: Gastroenterology

## 2013-07-02 ENCOUNTER — Telehealth (INDEPENDENT_AMBULATORY_CARE_PROVIDER_SITE_OTHER): Payer: Self-pay | Admitting: Gastroenterology

## 2013-07-02 DIAGNOSIS — K5190 Ulcerative colitis, unspecified, without complications: Principal | ICD-10-CM | POA: Insufficient documentation

## 2013-07-02 DIAGNOSIS — K529 Noninfective gastroenteritis and colitis, unspecified: Secondary | ICD-10-CM

## 2013-07-02 LAB — CBC WITH DIFF, BLOOD
ANC-Automated: 2.3 10*3/uL (ref 1.6–7.0)
Abs Lymphs: 1.2 10*3/uL (ref 0.8–3.1)
Abs Monos: 0.4 10*3/uL (ref 0.2–0.8)
Basophils: 1 % (ref 0–2)
Eosinophils: 1 % (ref 1–4)
Hct: 37.4 % (ref 34.0–45.0)
Hgb: 12.5 gm/dL (ref 11.2–15.7)
Lymphocytes: 30 % (ref 19–53)
MCH: 30.2 pg (ref 26.0–32.0)
MCHC: 33.4 % (ref 32.0–36.0)
MCV: 90.3 um3 (ref 79.0–95.0)
MPV: 10.6 fL (ref 9.4–12.4)
Monocytes: 10 % (ref 5–12)
Plt Count: 205 10*3/uL (ref 140–370)
RBC: 4.14 10*6/uL (ref 3.90–5.20)
RDW: 12.8 % (ref 12.0–14.0)
Segs: 58 % (ref 34–71)
WBC: 3.9 10*3/uL — ABNORMAL LOW (ref 4.0–10.0)

## 2013-07-02 LAB — LIVER PANEL, BLOOD
ALT (SGPT): 20 U/L (ref 0–33)
AST (SGOT): 30 U/L (ref 0–32)
Albumin: 4.6 g/dL (ref 3.5–5.2)
Alkaline Phos: 79 U/L (ref 35–140)
Bilirubin, Dir: <0.2 mg/dL (ref <0.2)
Bilirubin, Tot: 0.36 mg/dL (ref <1.20)
Total Protein: 7.1 g/dL (ref 6.0–8.0)

## 2013-07-02 LAB — CREATININE W/GFR: Creatinine: 0.82 mg/dL (ref 0.51–0.95)

## 2013-07-02 LAB — GFR: GFR: >60 mL/min

## 2013-07-02 NOTE — Telephone Encounter (Signed)
I called pt about message below.   Left message to call back.

## 2013-07-02 NOTE — Telephone Encounter (Signed)
Wbc cell count mildly low. Continue current meds, and repeat in 2 weeks. Thanks

## 2013-07-29 NOTE — Telephone Encounter (Signed)
Repeat lft and cbc.   Thanks (ordered)

## 2013-07-29 NOTE — Telephone Encounter (Signed)
I called pt to ask her to come back in and repeat labs.   Left detailed message.

## 2013-08-08 ENCOUNTER — Other Ambulatory Visit: Payer: Commercial Managed Care - PPO | Attending: Gastroenterology

## 2013-08-08 DIAGNOSIS — K529 Noninfective gastroenteritis and colitis, unspecified: Secondary | ICD-10-CM

## 2013-08-08 LAB — LIVER PANEL, BLOOD
ALBUMIN: 4.5 g/dL (ref 3.5–5.2)
ALKALINE PHOS: 74 U/L (ref 35–140)
ALT (SGPT): 14 U/L (ref 0–33)
AST (SGOT): 15 U/L (ref 0–32)
BILIRUBIN, TOT: 0.45 mg/dL (ref <1.20)
Bilirubin, Dir: <0.2 mg/dL (ref <0.2)
TOTAL PROTEIN: 6.9 g/dL (ref 6.0–8.0)

## 2013-08-08 LAB — CBC WITH DIFF, BLOOD
ANC-Automated: 2.3 10*3/uL (ref 1.6–7.0)
Abs Basophils: 0.1 10*3/uL (ref <0.1)
Abs Eosinophils: 0.1 10*3/uL (ref 0.1–0.5)
Abs Lymphs: 1.2 10*3/uL (ref 0.8–3.1)
Abs Monos: 0.4 10*3/uL (ref 0.2–0.8)
BASOPHILS: 2 % (ref 0–2)
EOSINOPHILS: 1 % (ref 1–4)
HGB: 12.3 gm/dL (ref 11.2–15.7)
Hct: 36.3 % (ref 34.0–45.0)
Lymphocytes: 30 % (ref 19–53)
MCH: 30.7 pg (ref 26.0–32.0)
MCHC: 33.9 % (ref 32.0–36.0)
MCV: 90.5 um3 (ref 79.0–95.0)
MONOCYTES: 10 % (ref 5–12)
MPV: 10.7 fL (ref 9.4–12.4)
Plt Count: 187 10*3/uL (ref 140–370)
RBC: 4.01 10*6/uL (ref 3.90–5.20)
RDW: 12.7 % (ref 12.0–14.0)
Segs: 57 % (ref 34–71)
WBC: 4 10*3/uL (ref 4.0–10.0)

## 2013-08-15 ENCOUNTER — Telehealth (INDEPENDENT_AMBULATORY_CARE_PROVIDER_SITE_OTHER): Payer: Self-pay | Admitting: Gastroenterology

## 2013-08-15 NOTE — Telephone Encounter (Signed)
Please call patient to discuss her recent lab results 08/08/2013.  Patient was informed to sign up for mychart and was given activation code.

## 2013-08-16 NOTE — Telephone Encounter (Signed)
I called pt and discussed lab results.   WBC has normalized.   She is due for a follow up visit in clinic with Dr. Mliss FritzLevesque.   Will route to schedulers to contact pt for an appt, next available.

## 2013-08-20 NOTE — Telephone Encounter (Signed)
Left message for patient to call back for AttapulgusErica or Clerance LavMarianne to schedule her follow up with Dr. Mliss FritzLevesque.

## 2013-08-22 NOTE — Telephone Encounter (Signed)
Patient scheduled for 10/09/13.

## 2013-08-22 NOTE — Telephone Encounter (Signed)
Patient is returning call per below message.  Please call to schedule.  Thank you.

## 2013-10-09 ENCOUNTER — Ambulatory Visit (INDEPENDENT_AMBULATORY_CARE_PROVIDER_SITE_OTHER): Payer: Commercial Managed Care - PPO | Admitting: Gastroenterology

## 2013-10-09 ENCOUNTER — Encounter (INDEPENDENT_AMBULATORY_CARE_PROVIDER_SITE_OTHER): Payer: Self-pay

## 2013-10-09 VITALS — BP 129/82 | HR 90 | Temp 97.9°F | Resp 16 | Ht 64.0 in | Wt 130.6 lb

## 2013-10-09 DIAGNOSIS — K5190 Ulcerative colitis, unspecified, without complications: Secondary | ICD-10-CM

## 2013-10-09 DIAGNOSIS — Z006 Encounter for examination for normal comparison and control in clinical research program: Secondary | ICD-10-CM

## 2013-10-09 DIAGNOSIS — K51919 Ulcerative colitis, unspecified with unspecified complications: Principal | ICD-10-CM

## 2013-10-09 MED ORDER — PEG-KCL-NACL-NASULF-NA ASC-C 100 GM OR SOLR
1.0000 | Freq: Once | ORAL | Status: AC
Start: 2013-10-09 — End: 2013-10-09

## 2013-10-09 MED ORDER — MESALAMINE 800 MG PO TBEC
3200.0000 mg | DELAYED_RELEASE_TABLET | Freq: Every day | ORAL | Status: AC
Start: 2013-10-09 — End: ?

## 2013-10-09 MED ORDER — BUPROPION XL (DAILY) 300 MG OR TB24
ORAL_TABLET | ORAL | Status: AC
Start: 2013-09-05 — End: ?

## 2013-10-09 MED ORDER — MESALAMINE 1000 MG RE SUPP
1000.0000 mg | Freq: Every day | RECTAL | Status: AC
Start: 2013-10-09 — End: ?

## 2013-10-09 NOTE — Interdisciplinary (Signed)
Pt here for F/U. Pt has no current sx. Pt states no recent hospitalization.

## 2013-10-09 NOTE — Progress Notes (Signed)
IBD F/U note  [facility]  Follow Up Visit  Visit practitioner:  Blima Ledger    Date / Time: 10/09/2013 9:32 AM    Referring Provider: Self, Referred     Reason for Visit: Ulcerative colitis    Type of Visit: Follow up    History of Present Illness:   Erica Sparks is a 63 year old female with history of ulcerative colitis (diagnosed in 1995) currently on asacol 3.2 g daily and intermittent canasa who is currently in clinical remission having 0-1 BM per day.  Last colonoscopy in 11/2012 showing endoscopic remission without evidence of active histologic inflammation, nor dysplasia.    Patient Active Problem List    Diagnosis Date Noted    Ulcerative colitis 05/19/2011     1995- diagnosed with ulcerative colitis with bloody diarrhea, dx made on flex sig/colonoscopy, treated with 5ASAs  Imuran 2001-2004  Remicade 2003-May 2010, did well. Stopped because didn't want to continue long term (time consuming, expensive)  May 2010- colonoscopy transverse colon inflammation  2010-2012- did well overall  August 2011- TPMT activity normal  08/16/10 [facility]- was on Asacol 4.8 or 3.6g/d tapered to 2.4g/d and intermittent Canasa. Also was having constipation  April 2012 - colonoscopy - mild proctitis, small polyps in cecum and rectum. Path ??  05/19/11 [facility] - occasional constipation. Typically 0-1 bm/d. Last couple of weeks does feel some incomplete evacuation. No blood. No straining. Gained some weight. Takes Asacol HD 3.2 g/d and Canasa most days. No rashes. Joint pains: L knee. ?Fibromyalgia. No eye problems.  1/27/14[facility] clinic: asacolHD  3.2g/day, intermittent suppositories. At baseline, 0-1 BM/day, creatinine and cbc normal. Following calcium, vitamin D with PCP, yearly flu shot. Plan: surveillance colonoscopy in May/June, and follow up in November. , continue current medications  12/17/12: Colonoscopy - normal appearing colonoscopy except mild erythema in the  rectum, overall endoscopic remission, surveillance biopsies performed. Path - normal colonic biopsies with architectural distortion, no active inflammation in the descending and left colon.   01/09/13: [facility] : asacol 3.2g, canasa 3x /week. bm 0-1/day, no blood;   10/09/13: [facility]: Asacol 3.2 g/day (2 in the morning and 2 at night), canasa every 2-3 days. 0-2 BM per day without bleeding. No urgency. No changes in appetite and weight.  Vit D 1,000 IU daily.  Ongoing fatigue.  Recent back injury 2 days ago.      HTN (hypertension) 05/19/2011    Depression 05/19/2011     Not active.          Past Medical History   Diagnosis Date    Ulcerative colitis 05/19/2011    HTN (hypertension) 05/19/2011    Depression 05/19/2011       Past Surgical History   Procedure Laterality Date    No past surgical history      Colonoscopy procedure  12/17/2012     Procedure: GI COLONOSCOPY;  Surgeon: Wynelle Bourgeois., MD;  Location: [facility];  Service: Gastroenterology;;       Current Medications:    buPROPion (WELLBUTRIN) 300 MG Extended-Release tablet    cholecalciferol (VITAMIN D) 1000 UNIT tablet Take 1,000 Units by mouth daily.   mesalamine (ASACOL HD) 800 MG TBEC EC tablet Take 4 tablets by mouth daily.   Mesalamine (ASACOL HD) 800 MG TBEC Take 800 mg by mouth. Four tabs BID   mesalamine (CANASA) 1000 MG suppository Insert 1 suppository rectally daily.  multivitamin (MULTI-VITAMIN) tablet Take 1 tablet by mouth daily.   olmesartan (BENICAR) 5 MG tablet Take 5 mg by mouth daily.   venlafaxine (EFFEXOR) 75 MG tablet Take 75 mg by mouth daily.       Allergies:  No Known Allergies    Family History:  Family History   Problem Relation Age of Onset    Gastrointestinal Neg Hx      no family history of IBD or colon cancer       History     Social History    Marital Status: Single     Spouse Name: N/A     Number of Children: N/A    Years of Education: N/A     Occupational History     Youth worker     Social History Main Topics    Smoking status: Never Smoker     Smokeless tobacco: Never Used    Alcohol Use: Yes      Comment: bottle wine per week (spread out 2 glasses per sitting)    Drug Use: No    Sexual Activity: Not on file     Other Topics Concern    Not on file     Social History Narrative    No narrative on file       Review of Systems:  Weight loss:no  Trouble breathing: no  Chest pain: no  Abdominal pain: no  Nausea / vomitting: no  Kidney problems: no  Back pain: no  Seizures: no  Diabetes: no  Thyroid problems: no  Skin rash: no      Physical examination:   BP 129/82   Pulse 90   Temp(Src) 97.9 F (36.6 C) (Oral)   Resp 16   Ht '5\' 4"'  (1.626 m)   Wt 59.24 kg (130 lb 9.6 oz)   BMI 22.41 kg/m2 Body mass index is 22.41 kg/(m^2).  Wt Readings from Last 2 Encounters:   10/09/13 59.24 kg (130 lb 9.6 oz)   01/09/13 66.679 kg (147 lb)      Blood Pressure   10/09/13 129/82   01/09/13 125/78      Pain Score: 0  General:  Well developed, well nourished in no apparent distress.  Affect:  Normal  HEENT:  No scleral icterus, Trachea midline, mucus membranes moist.  Extremities:  No peripheral edema. Warm well-perfused.  Skin:  Non-icteric and no visible rash  Neuro:  All motor strength grossly intact.  Psych:  Alert oriented X3, No anxiety or depression. Normal affect.    Lab Results  Lab Results   Component Value Date    WBC 4.0 08/08/2013    RBC 4.01 08/08/2013    HGB 12.3 08/08/2013    HCT 36.3 08/08/2013    MCV 90.5 08/08/2013    MCHC 33.9 08/08/2013    RDW 12.7 08/08/2013    PLT 187 08/08/2013    MPV 10.7 08/08/2013     Lab Results   Component Value Date    CREAT 0.82 07/02/2013    TBILI 0.45 08/08/2013    ALB 4.5 08/08/2013    TP 6.9 08/08/2013    AST 15 08/08/2013    ALK 74 08/08/2013    ALT 14 08/08/2013     Lab Results   Component Value Date    AST 15 08/08/2013    ALT 14 08/08/2013    ALK 74 08/08/2013    TP 6.9 08/08/2013    ALB 4.5 08/08/2013  TBILI 0.45 08/08/2013    DBILI <0.2 08/08/2013        Impression:   63 yo F with ulcerative colitis (diagnosed in 1995) currently in clinical remission having 0 to 2 bowel movements per day without any bleeding.  Based on her colonoscopy in 6/14, she is currently in endoscopic and histologic remission on asacol 3.2 g daily and intermittent canasa.  Based on her disease duration, she should likely have annual colonoscopies.     Plan:   1. Continue asacol 3.2 g daily. May take all pills once daily.  2. Continue canasa as needed.  3. Check labs: CMP, CBC, vit D, vit B12, CRP-HS  4. Recommend annual screening colonoscopy with surveillance biopsies  5. Will consider IBD biobank    RTC in 6 months    Olsalazine, balsalazide, mesalamine toxicity: headache, rash, alopecia, interstitial nephritis, pericarditis, pneumonitis, hepatitis, pancreatitis, worsening diarrhea and abdominal pain.    The patient was discussed with and seen by Dr. Sabino [facility]  Electronically Signed by:  Blima Ledger MD  Gastroenterology   10/09/2013 9:32 AM

## 2013-10-09 NOTE — Progress Notes (Signed)
Attending Note:  Cc: ulcerative colitis (1995)     Subjective:  I reviewed the history.  Patient interviewed and examined.  Pertinent notes from History of present illness (HPI) include  HPI:    Dx 1995, originally treated with 5asa, imuran (2000-2004), then remicade 2003-2010)  2012: saw dr Mliss Fritzlevesque at [facility], treated with 5asa    Currently on asacol 3.2g/day, canasa every 2-3 /day, no bleeding, BM 0-2/day  Fatigue is chronic over years  Review of Systems (ROS): As per the fellow's note.  Past Medical, Family, Social History:  As per the fellow's  note.    Objective:   I have examined the patient and I concur with the fellow's exam.  Abnormalities on exam:   Anicteric  Mmm  ctab  No cervical lad  rrr  Ab: soft, nt, nd , Bowel sounds present  No edema    Assessment and plan reviewed with the fellow. I agree with the fellow's plan as documented.  2014-June: normal surveillance colonoscopy, mucosal healing, histologic healing    Impression/Report/Plan:    Impression:   1. Ulcerative colitis (x 20 years), in clinical remission on mesalamine  - continue current medications  - surveillance every year to year and a half (next between now and September, by me or Dr Marilynne [place])  - lab tests twice yearly    Risks of therapy discussed include need for monitoring of lab tests and surveillance    Recommendations:  - cbc, lft, creatinine, tsh  - surveillance colonoscopy  - PCP evaluation for Chronic fatigue if worsens  See the fellow's note for further details.    Pavan Bring G. Mliss FritzLevesque MD  Division of Gastroenterology

## 2013-10-09 NOTE — Patient Instructions (Addendum)
1. Continue asacol 3.2 g daily. You may take these all at once.  2. Continue canasa as needed.  3. Schedule colonoscopy sometime between now and September     RTC in 6 months Marilynne Halsted([place] or [place])     IBD PATIENT EDUCATION & SUPPORT GROUP     DATE:   Oct 20, 2013    TIME:     1:00pm - 4:00pm    Topics:  Getting the Most Out of Your Visit: During and After Your Appointment   Adjusting to Illness: Identifying Your Personal Challenges     Speakers:  Rosezella FloridaElisabeth Evans, NP & Leilani AbleMarci Reiss, LCSW    RSVP  Please e-mail [email or web address] or call [phone]  [email or web address]

## 2013-11-06 ENCOUNTER — Other Ambulatory Visit: Payer: Commercial Managed Care - PPO | Attending: Gastroenterology

## 2013-11-06 DIAGNOSIS — K51919 Ulcerative colitis, unspecified with unspecified complications: Secondary | ICD-10-CM

## 2013-11-06 DIAGNOSIS — K5190 Ulcerative colitis, unspecified, without complications: Principal | ICD-10-CM | POA: Insufficient documentation

## 2013-11-06 LAB — CBC WITH DIFF, BLOOD
ANC-Automated: 2.4 10*3/uL (ref 1.6–7.0)
Abs Lymphs: 1.2 10*3/uL (ref 0.8–3.1)
Abs Monos: 0.4 10*3/uL (ref 0.2–0.8)
Basophils: 1 % (ref 0–2)
Eosinophils: 1 % (ref 1–4)
Hct: 39.1 % (ref 34.0–45.0)
Hgb: 13 gm/dL (ref 11.2–15.7)
Lymphocytes: 30 % (ref 19–53)
MCH: 30.7 pg (ref 26.0–32.0)
MCHC: 33.2 % (ref 32.0–36.0)
MCV: 92.4 um3 (ref 79.0–95.0)
MPV: 10.9 fL (ref 9.4–12.4)
Monocytes: 9 % (ref 5–12)
Plt Count: 188 10*3/uL (ref 140–370)
RBC: 4.23 10*6/uL (ref 3.90–5.20)
RDW: 12.7 % (ref 12.0–14.0)
Segs: 59 % (ref 34–71)
WBC: 4 10*3/uL (ref 4.0–10.0)

## 2013-11-08 ENCOUNTER — Telehealth (INDEPENDENT_AMBULATORY_CARE_PROVIDER_SITE_OTHER): Payer: Self-pay | Admitting: Gastroenterology

## 2013-11-08 LAB — COMPREHENSIVE METABOLIC PANEL, BLOOD
ALT (SGPT): 14 U/L (ref 0–33)
AST (SGOT): 19 U/L (ref 0–32)
Albumin: 4.5 g/dL (ref 3.5–5.2)
Alkaline Phos: 63 U/L (ref 35–140)
Anion Gap: 20 mmol/L — ABNORMAL HIGH (ref 7–15)
BUN: 10 mg/dL (ref 6–20)
Bicarbonate: 23 mmol/L (ref 22–29)
Bilirubin, Tot: 0.41 mg/dL (ref <1.20)
Calcium: 9.6 mg/dL (ref 8.5–10.6)
Chloride: 100 mmol/L (ref 98–107)
Creatinine: 0.79 mg/dL (ref 0.51–0.95)
GFR: >60 mL/min
Glucose: 100 mg/dL (ref 70–115)
Potassium: 4.4 mmol/L (ref 3.5–5.1)
Sodium: 143 mmol/L (ref 136–145)
Total Protein: 6.9 g/dL (ref 6.0–8.0)

## 2013-11-08 LAB — FREE THYROXINE, BLOOD: Free T4: 1.25 ng/dL (ref 0.93–1.70)

## 2013-11-08 LAB — VITAMIN D, 25-OH TOTAL
Vitamin D, 25-OH D2: <5 ng/mL
Vitamin D, 25-OH D3: 37 ng/mL
Vitamin D, 25-OH TOTAL: 37 ng/mL (ref 30–80)

## 2013-11-08 LAB — TSH, BLOOD: TSH: 2.83 u[IU]/mL (ref 0.27–4.20)

## 2013-11-08 LAB — CRP HS, BLOOD: CRP HS: 0.5 mg/L (ref <5)

## 2013-11-08 NOTE — Telephone Encounter (Signed)
-----   Message from Windell Hummingbird, MD sent at 11/08/2013  3:32 PM PDT -----  Correction: normal labs with the exception of anion gap.  Perhaps we can share these labs with patients PCP

## 2013-11-08 NOTE — Telephone Encounter (Signed)
Lab results routed to office of PCP Dr. Peter Minium

## 2016-10-14 IMAGING — NM THYROID METS WHOLE BODY SCAN
5 series · 6 of 6 positions shown · non-contrast
Comparison: None

THYROID METS WHOLE BODY SCAN, 10/12/2016 [DATE]:
CLINICAL INDICATION: History of follicular thyroid carcinoma diagnosed June 2015 treated with thyroid surgery x2. Ablation. Six-month follow-up.

[Series 1595: whole body · 2.26mm/px · 2 of 2 frames shown]
[frame 1/2  full-range]
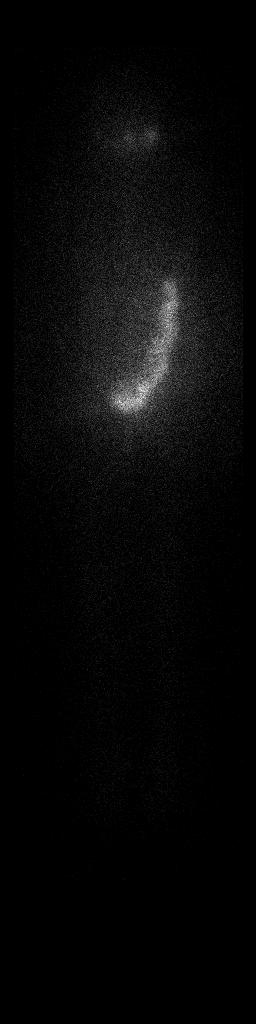
[frame 2/2  full-range]
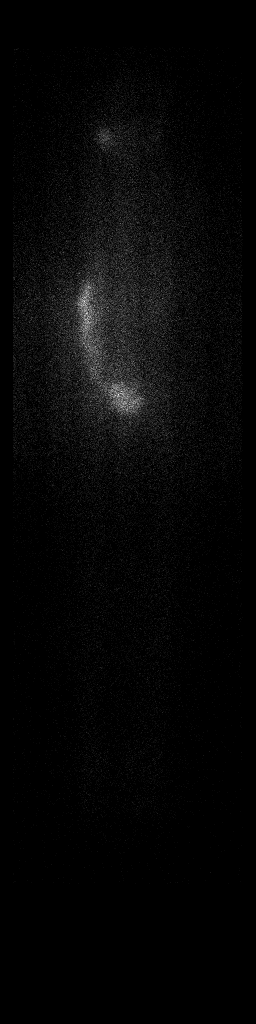

[Series 1597: ant · 0.85mm/px · 1 of 1 slices shown]
[im 1/1]
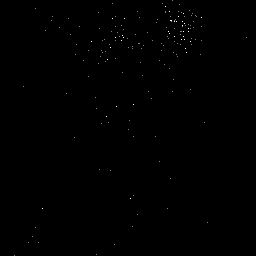

[Series 1599: marker · 0.85mm/px · 1 of 1 slices shown]
[im 1/1]
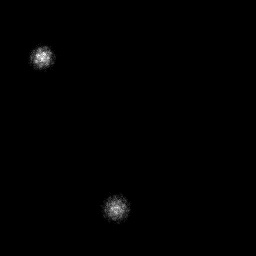

[Series 1600: rao · 0.85mm/px · 1 of 1 slices shown]
[im 1/1]
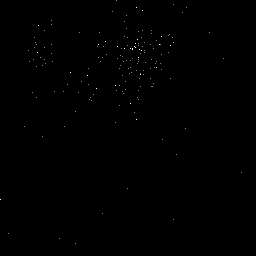

[Series 1601: lao · 0.85mm/px · 1 of 1 slices shown]
[im 1/1]
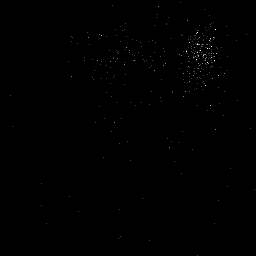

[6 of 6 positions shown; findings below may reference images not displayed]

FINDINGS: After administration of 4.3 mCi of iodine-545 administered orally delayed
images
were obtained at 48 hours.
No residual uptake is seen within the region of the thyroid bed. There is some
mild physiologic uptake identified overlying the parotid glands bilaterally
which is a normal physiologic uptake. There is also activity throughout the
left
colon which is also within normal limits given the known excretion into the
bowel. I do not see evidence of bone or lung lesions. No evidence of
metastatic
disease appreciated.
IMPRESSION: No evidence of metastatic disease or residual neoplasm in the region of the
thyroid bed.

## 2019-05-28 IMAGING — DX FOOT 3 VIEWS LEFT
1 series · 3 of 3 positions shown · non-contrast
Comparison: none

FOOT 3 VIEWS LEFT, 05/28/2019 [DATE]: 
CLINICAL INDICATION:  Bump between toes, second and third. Pain. History of 
thyroid cancer 
COMPARISON EXAMINATIONS: None prior

[Series 1: AP · U · 0.14mm/px · 3 of 3 slices shown]
[im 1/3]
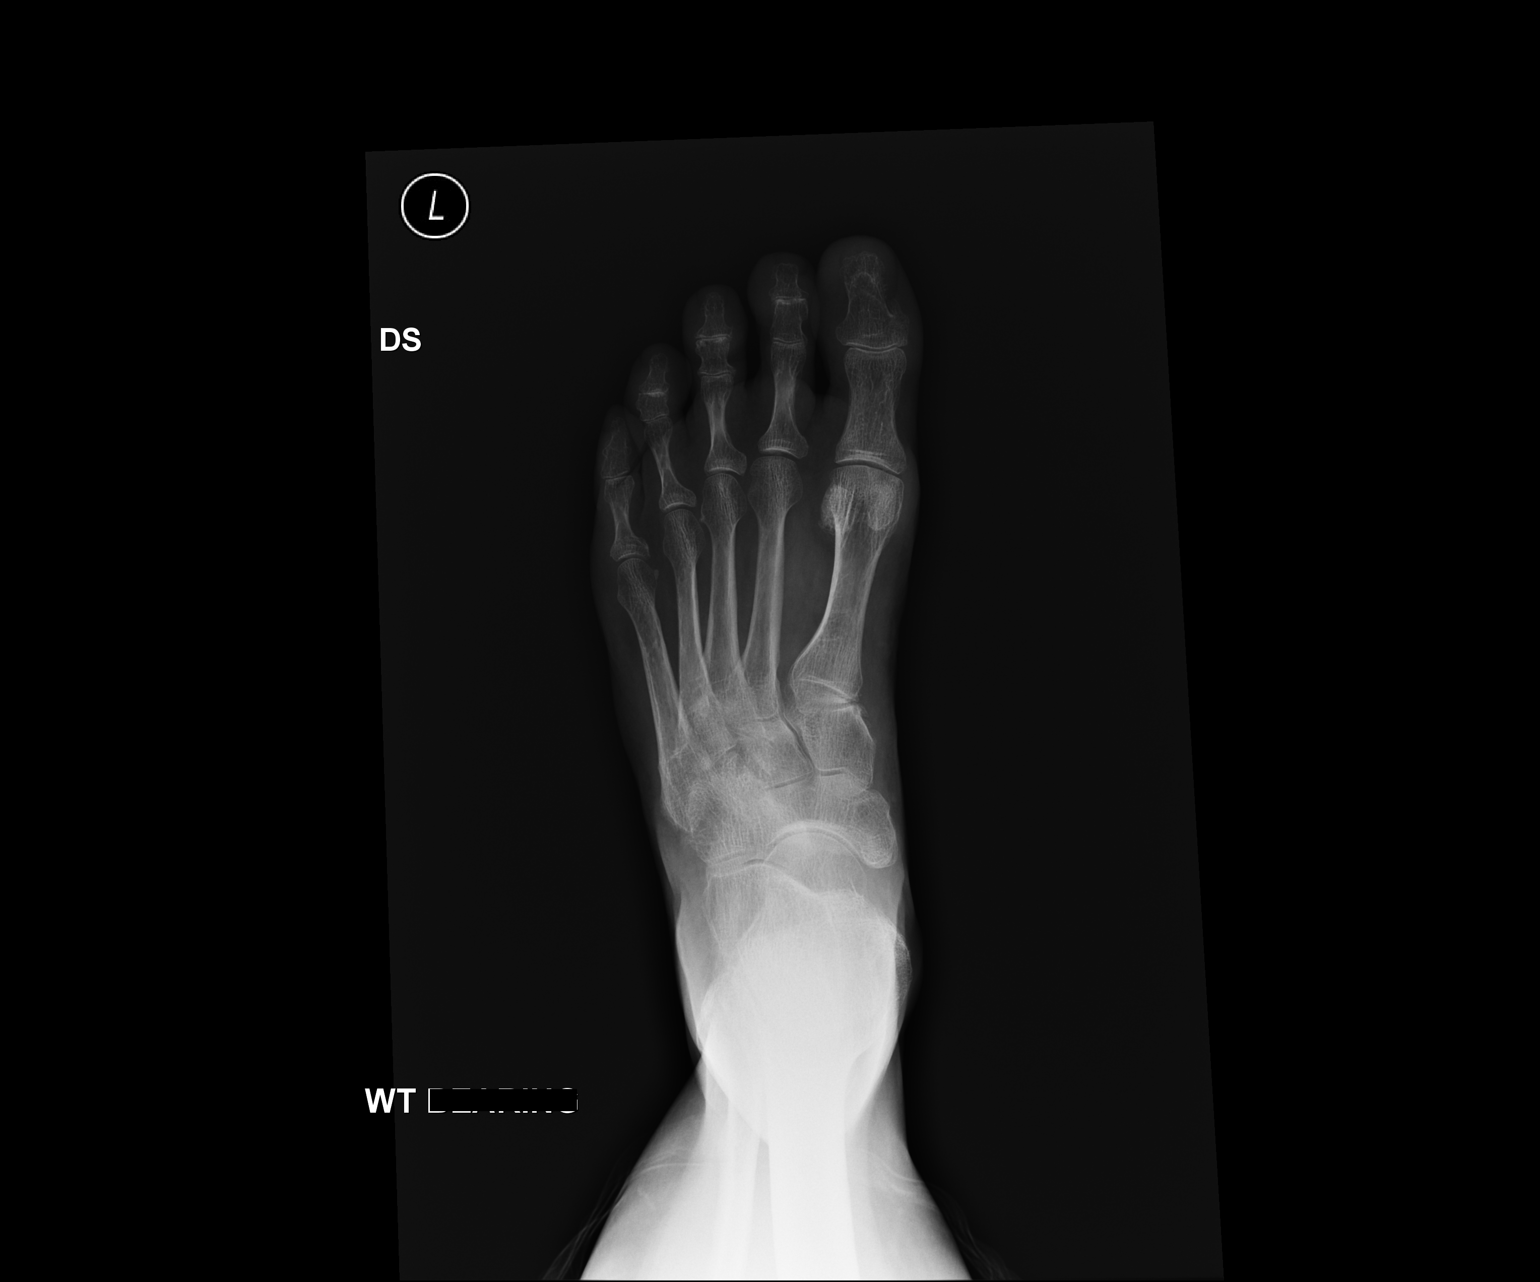
[im 2/3]
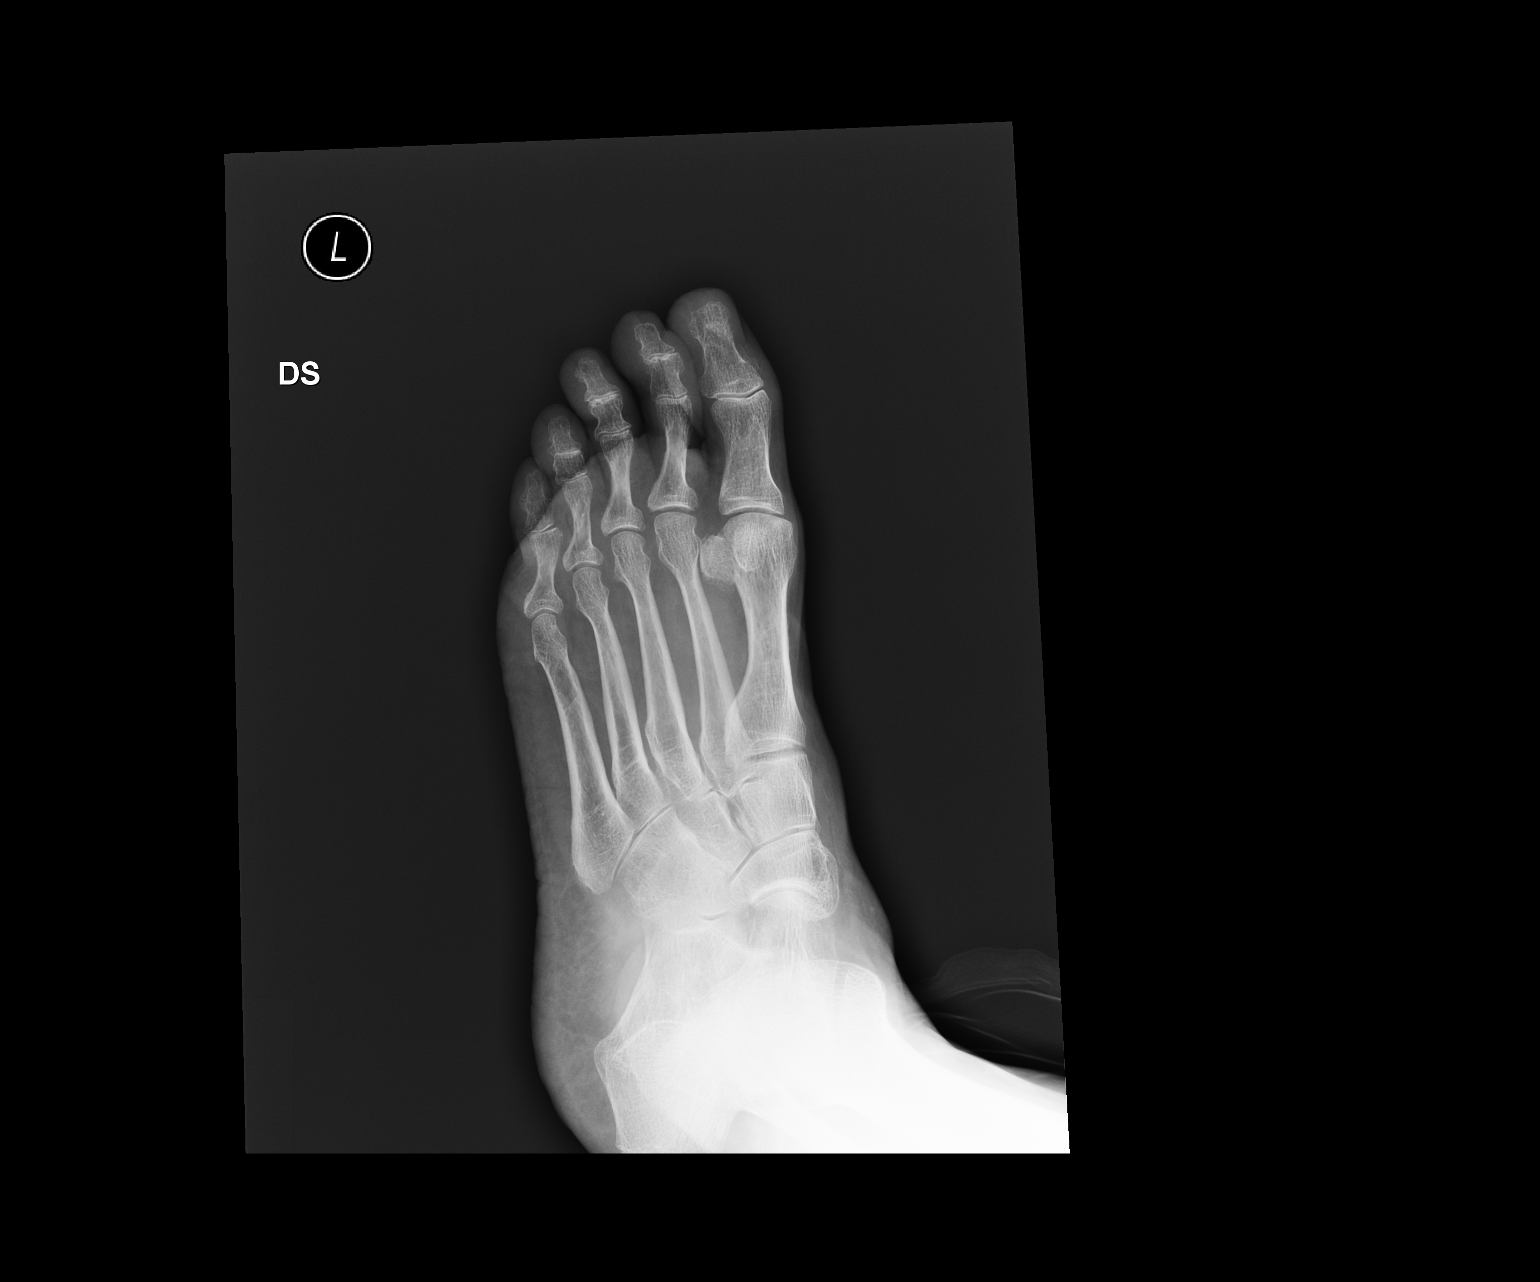
[im 3/3]
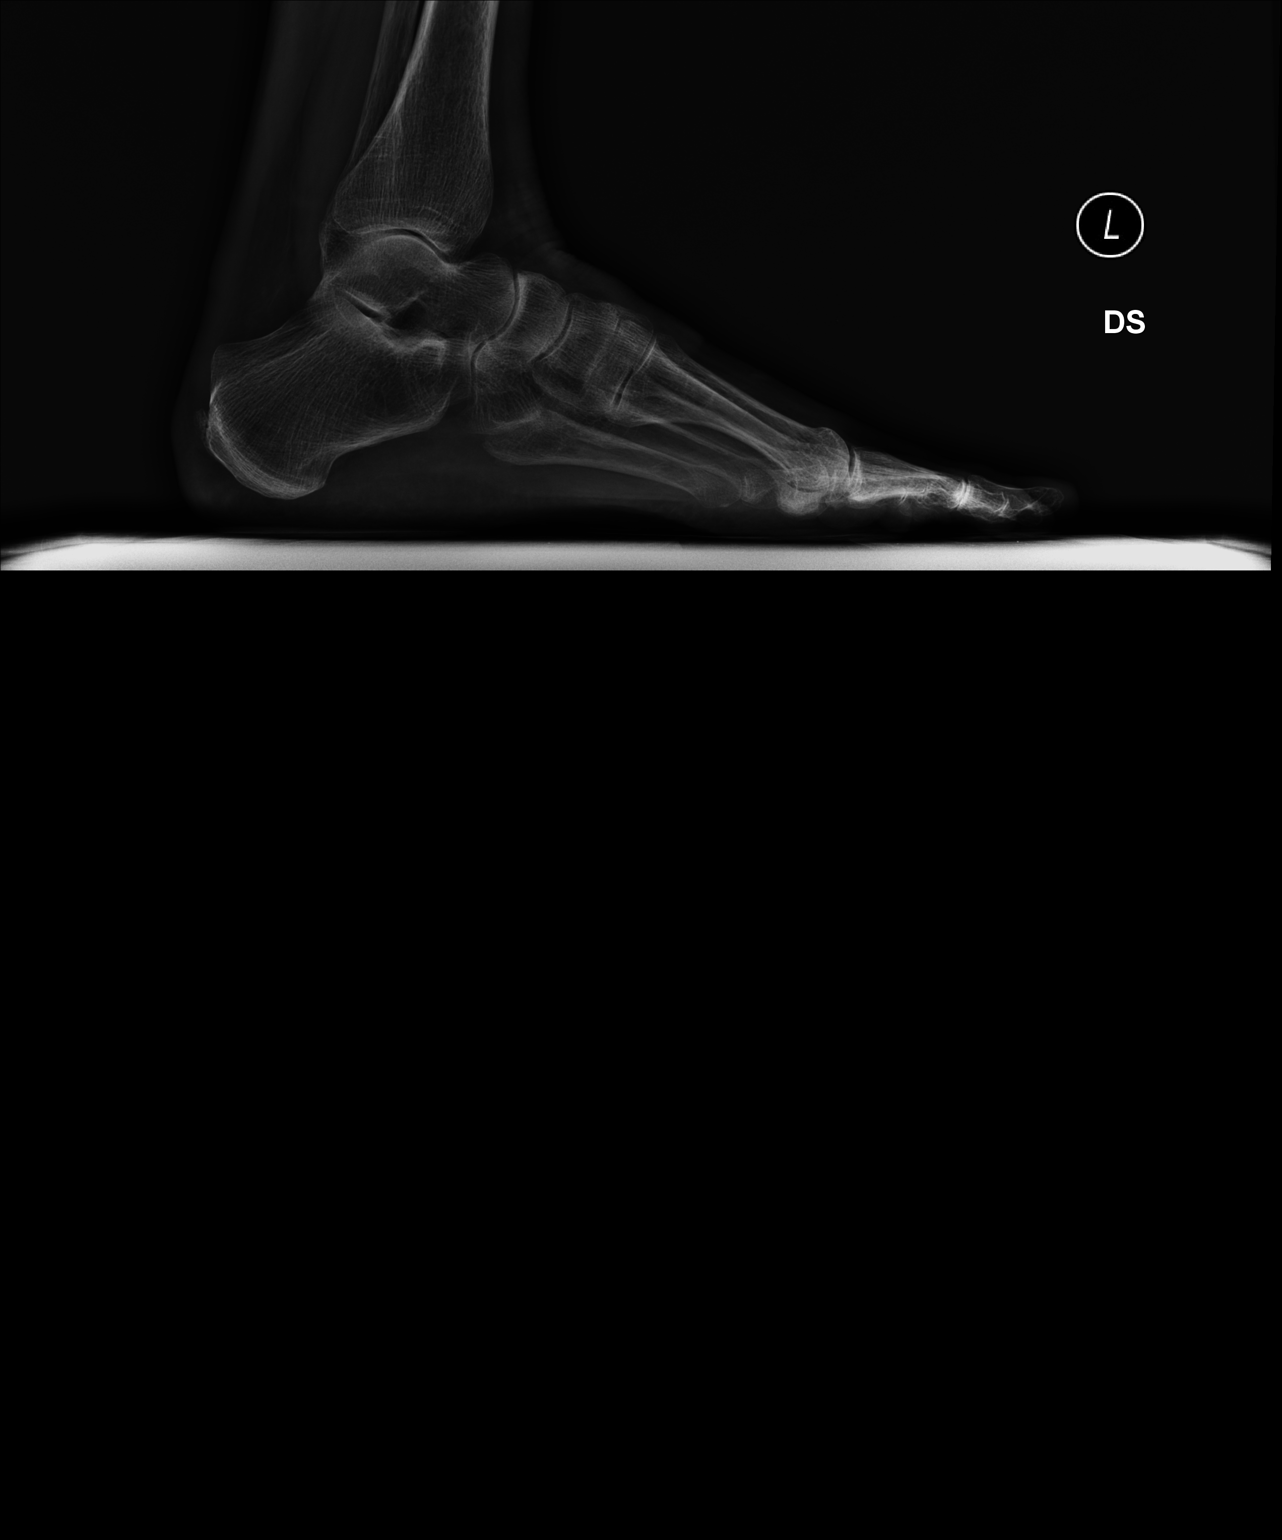

[3 of 3 positions shown; findings below may reference images not displayed]

FINDINGS: No fracture. Normal alignment. Joint spaces are preserved. No 
erosion. No focal soft tissue swelling.
IMPRESSION: Negative exam. Specifically, no abnormality about the second and third digits. 
If symptoms persist, consideration could be made for MRI exam potential for 
contrast administration.

## 2019-07-03 IMAGING — MR MRI LEFT FOOT WITHOUT CONTRAST
6 of 7 series · 35 of 40 positions shown · IV contrast (gadolinium)
Comparison: None.

MRI LEFT FOOT WITHOUT CONTRAST, 07/03/2019 [DATE]: 
CLINICAL INDICATION: Mass left third toe.
TECHNIQUE: Multiplanar, multiecho position MR images of the left forefoot were 
performed without intravenous gadolinium enhancement.

[Series 101: survey_fullfov_transversal · axial · 10.0mm · 1.84mm/px · z∈[-82,+284]mm · 6 of 36 slices shown]
[im 1/36]
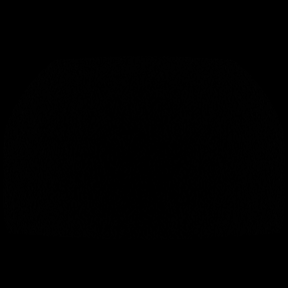
[im 8/36]
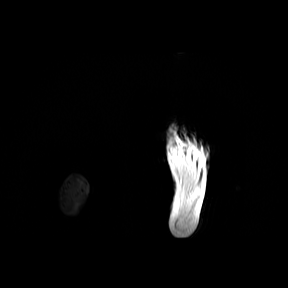
[im 15/36]
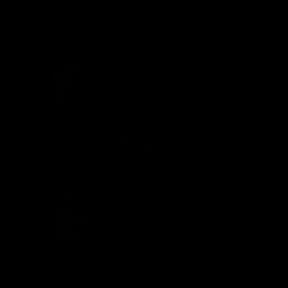
[im 22/36]
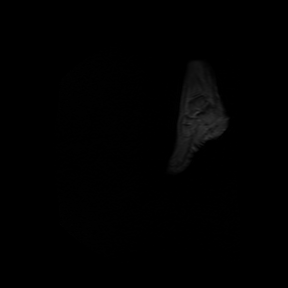
[im 29/36]
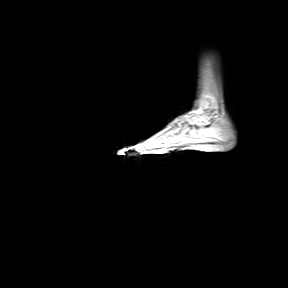
[im 36/36]
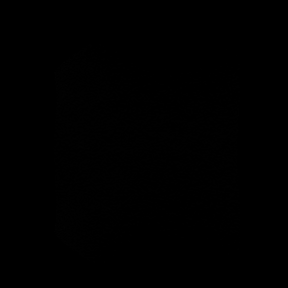

[Series 201: survey_left · axial · 10.0mm · 0.94mm/px · z∈[-50,+165]mm · 3 of 15 slices shown]
[im 1/15]
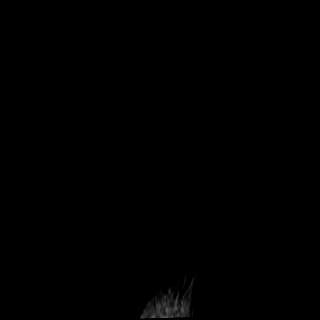
[im 8/15]
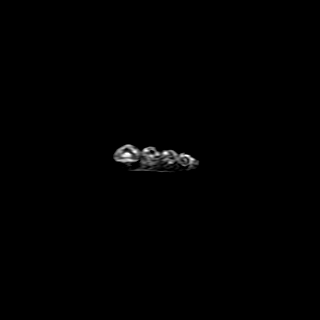
[im 15/15]
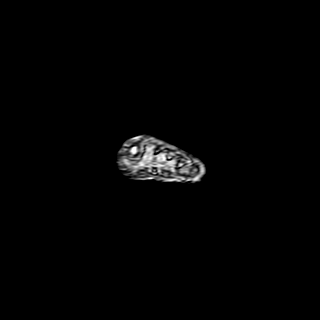

[Series 301: T1 · sagittal · 1.5mm · 0.25mm/px · 5 of 25 slices shown (1 of 2)]
[im 1/25]
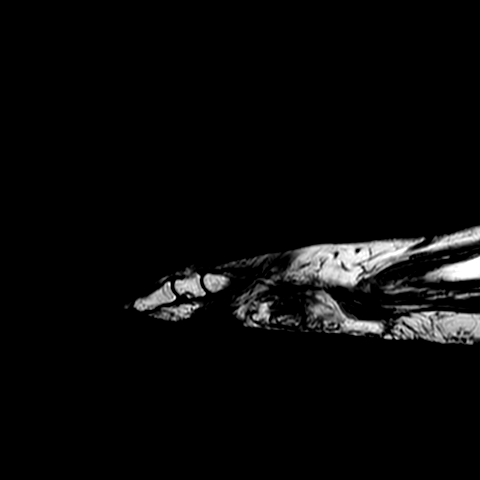
[im 7/25]
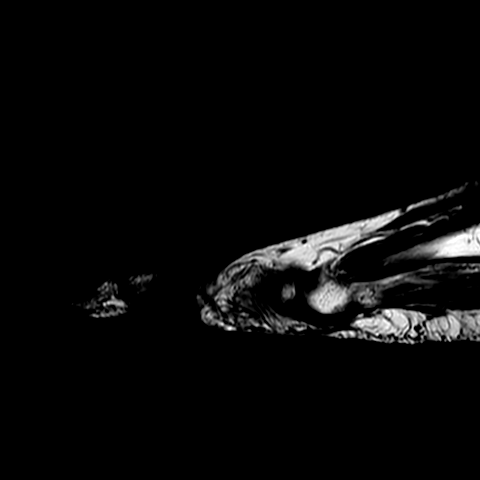
[im 13/25]
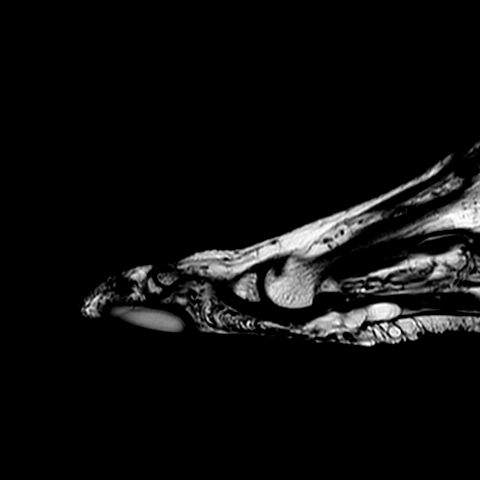
[im 19/25]
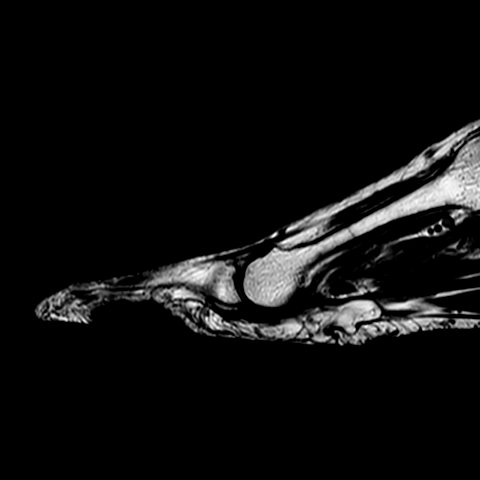
[im 25/25]
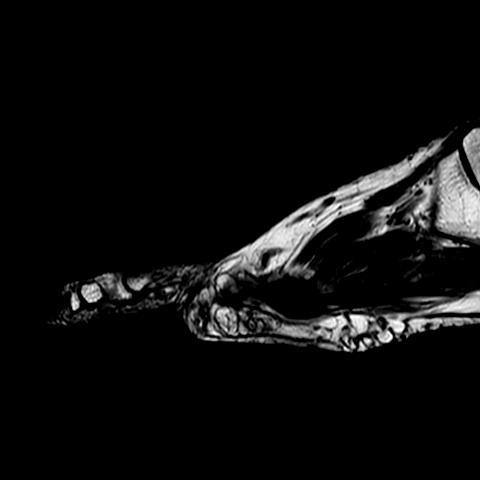

[Series 401: PD fat-sat · sagittal · 1.5mm · 0.28mm/px · 5 of 25 slices shown]
[im 1/25]
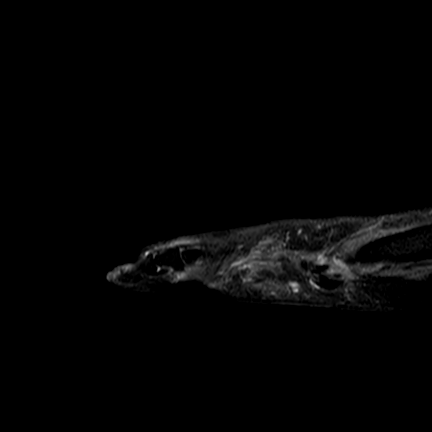
[im 7/25]
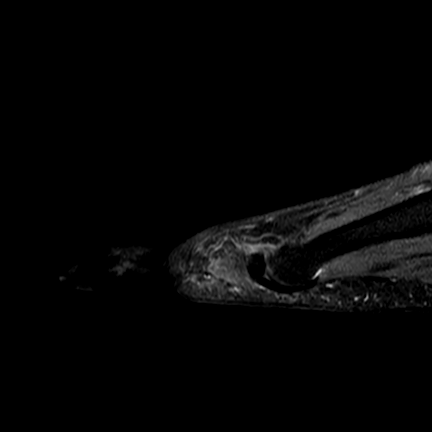
[im 13/25]
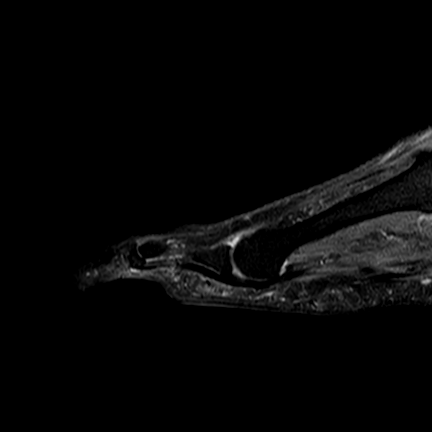
[im 19/25]
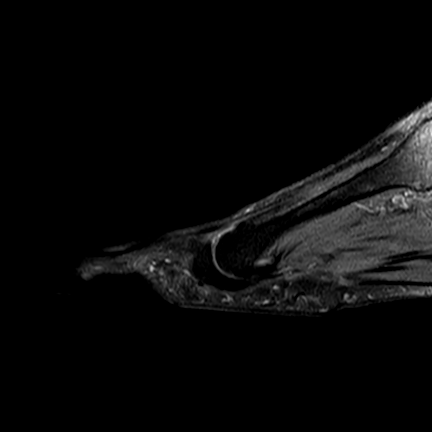
[im 25/25]
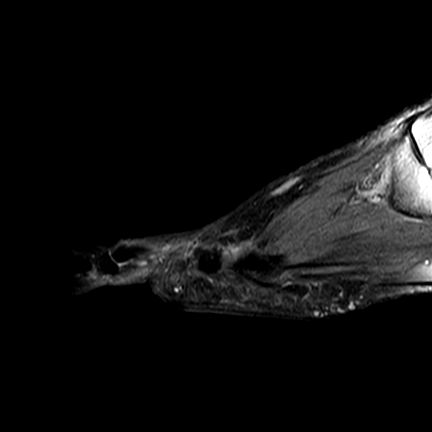

[Series 501: T1 · coronal · 2.0mm · 0.23mm/px · 8 of 40 slices shown (2 of 2)]
[im 1/40]
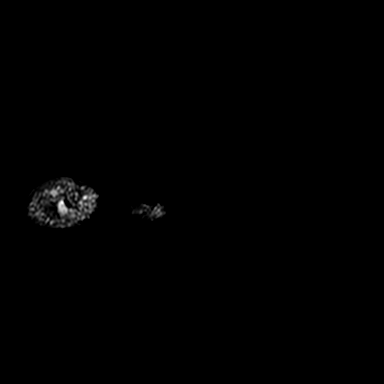
[im 6/40]
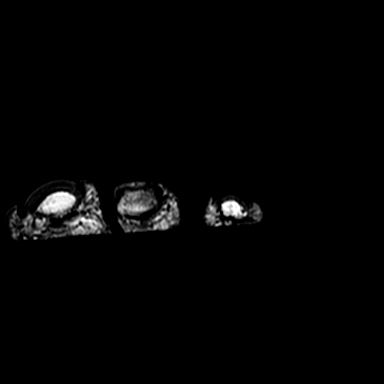
[im 12/40]
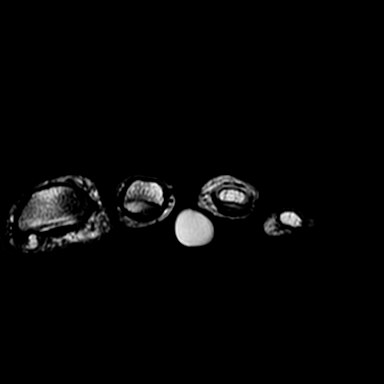
[im 17/40]
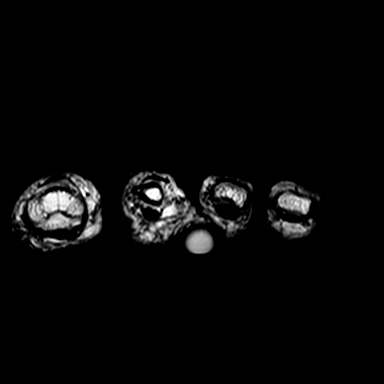
[im 23/40]
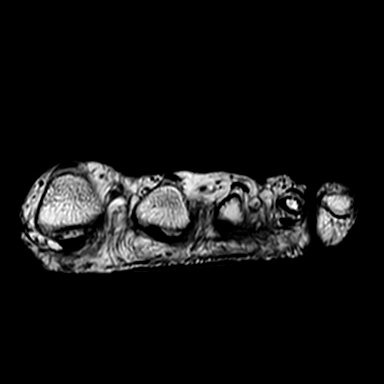
[im 28/40]
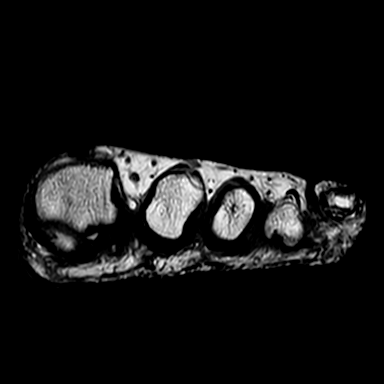
[im 34/40]
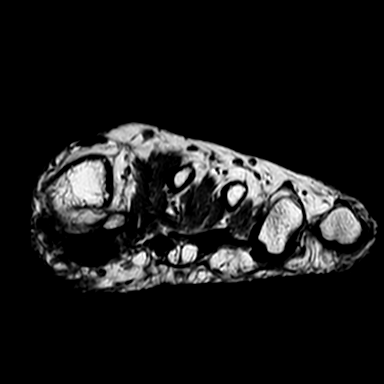
[im 40/40]
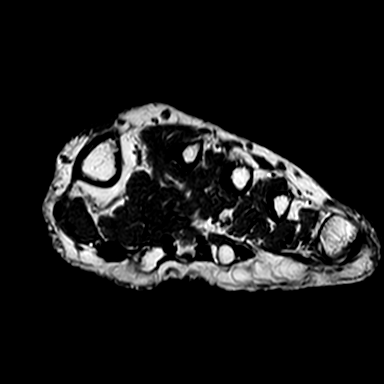

[Series 601: pdw_spair_ax · coronal · 2.0mm · 0.31mm/px · 8 of 39 slices shown]
[im 1/39]
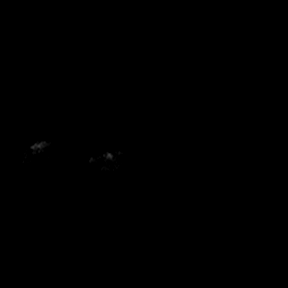
[im 6/39]
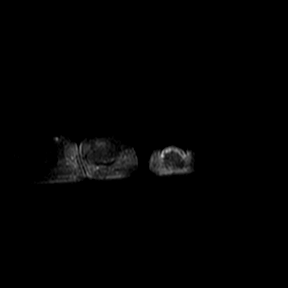
[im 11/39]
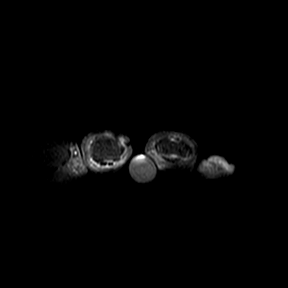
[im 17/39]
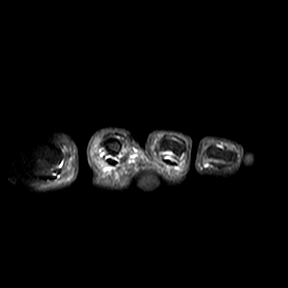
[im 22/39]
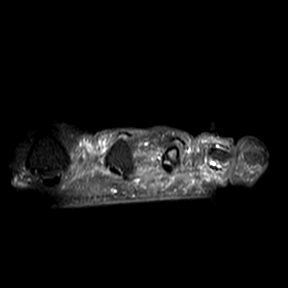
[im 28/39]
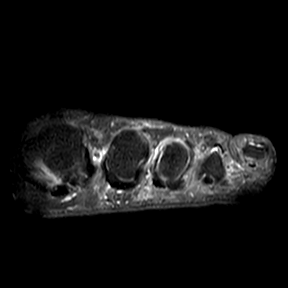
[im 33/39]
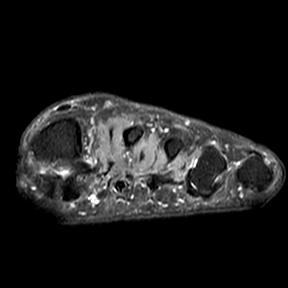
[im 39/39]
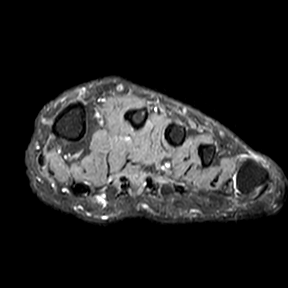

[35 of 40 positions shown; findings below may reference images not displayed]

FINDINGS: BONES/JOINTS: Normal bone marrow signal intensity. No osteomyelitis, fracture, 
contusion or stress response. Metatarsal-phalangeal (MTP) and interphalangeal 
(IP) joints are preserved. Mild degenerative change of the fibular hallux 
sesamoid and hallux-sesamoid articulation. Specifically, no plantar plate tear 
or capsulitis. 
SUBCUTANEOUS TISSUES: No ulceration. No abscess. 
MUSCLES: Musculature is symmetric without mass, signal abnormality or atrophy. 
TENDONS: The imaged portions of the flexor, extensor and peroneal tendons are 
intact. No tendon tear, tenosynovitis or tendinosis. No tendon subluxation. 
OTHER SOFT TISSUES: 0.3 and 0.2 cm cystic foci along the plantar medial deep 
subcutaneous soft tissues of the third toe at the level of the proximal 
interphalangeal (PIP) joint, consistent with small synovial cysts/ganglia. Mild 
central forefoot soft tissue swelling. No interdigital neuroma or bursitis. 
Plantar fascia is intact.
IMPRESSION: 1. 0.3 and 0.2 cm synovial cysts/ganglia along the plantar medial third toe at 
the level of the PIP joint at the region of interest. 
2. Mild central forefoot soft tissue swelling.

## 2021-06-22 IMAGING — MR MRI LEFT FOOT WITHOUT CONTRAST
4 of 6 series · 22 of 40 positions shown · IV contrast (gadolinium)
Comparison: MR exam of 07/03/2019.

________________________________________________________________________________________________ 
MRI LEFT FOOT WITHOUT CONTRAST, 06/22/2021 [DATE]: 
CLINICAL INDICATION: Pain in the left foot. Pain in the arch of left foot for 
several months. No trauma or injury. History of osteopenia.
TECHNIQUE: Multiplanar, multiecho position MR images of the foot were performed 
without intravenous gadolinium enhancement. Patient was scanned on a 3T magnet.

[Series 201: survey_mst · axial · 10.0mm · 0.94mm/px · z∈[-79,+154]mm · 4 of 15 slices shown]
[im 1/15]
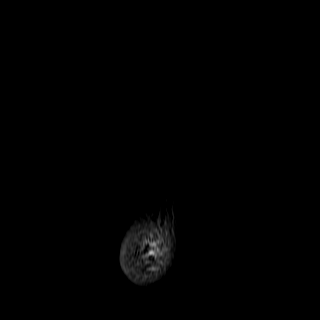
[im 5/15]
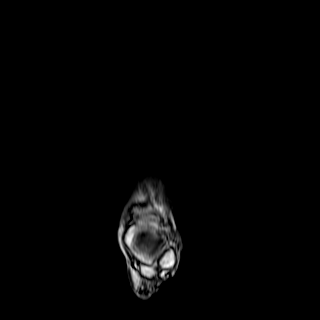
[im 10/15]
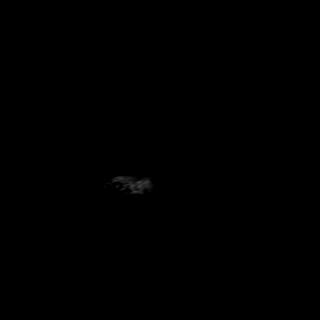
[im 15/15]
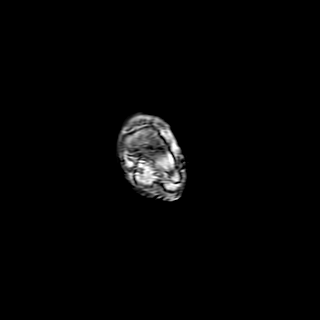

[Series 301: t1w_foot · sagittal · 2.5mm · 0.31mm/px · 5 of 30 slices shown]
[im 1/30]
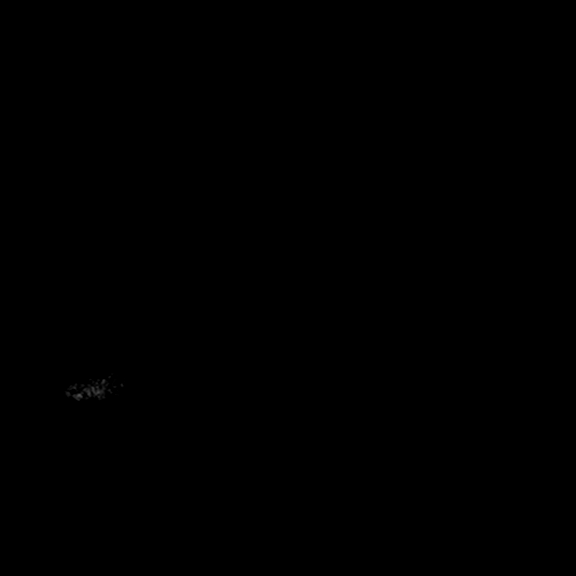
[im 8/30]
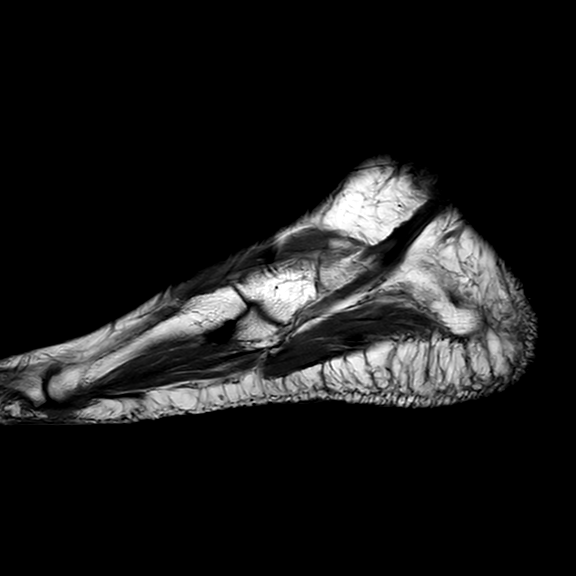
[im 15/30]
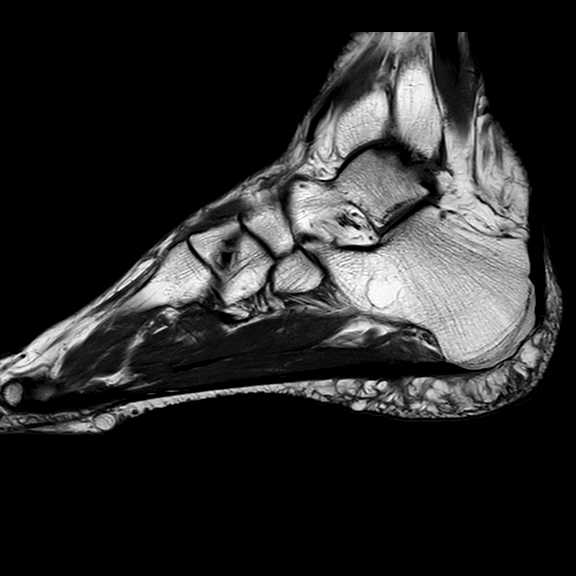
[im 22/30]
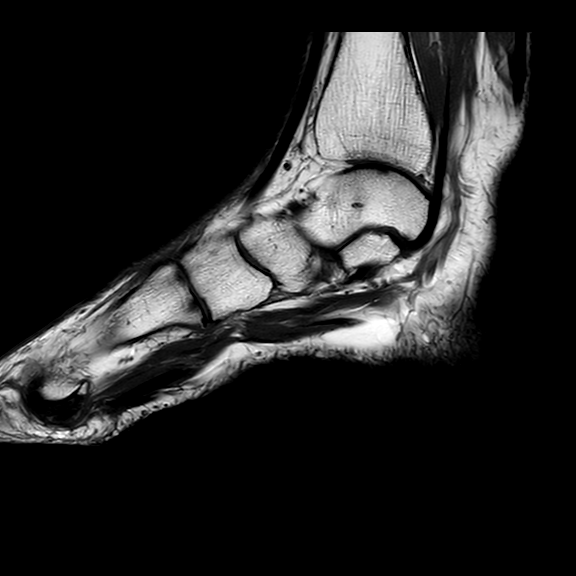
[im 30/30]
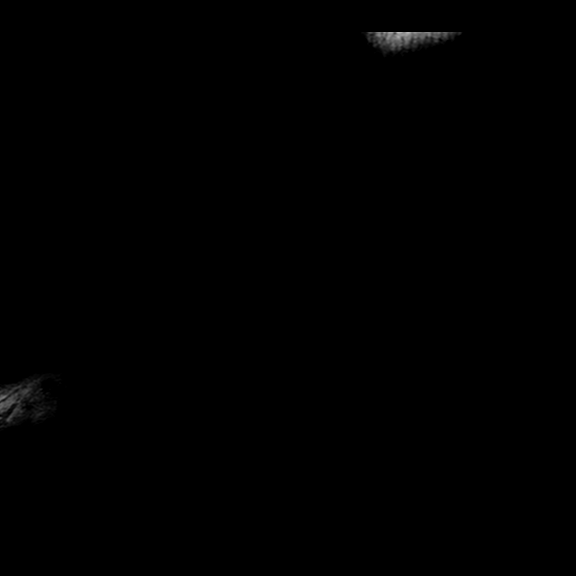

[Series 401: PD fat-sat · sagittal · 2.5mm · 0.35mm/px · 5 of 30 slices shown]
[im 1/30]
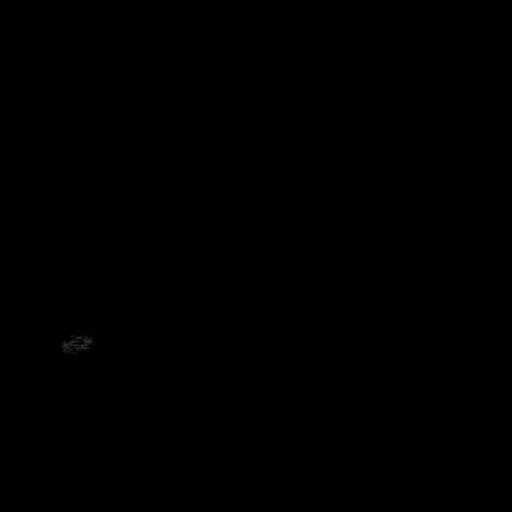
[im 8/30]
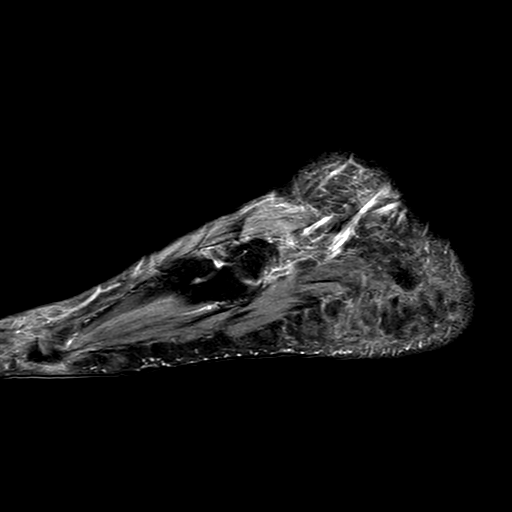
[im 15/30]
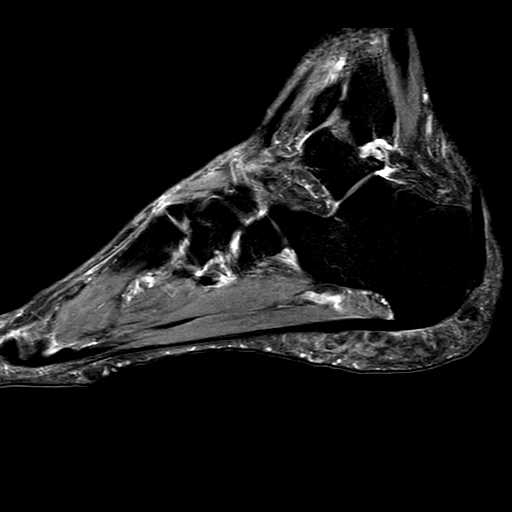
[im 22/30]
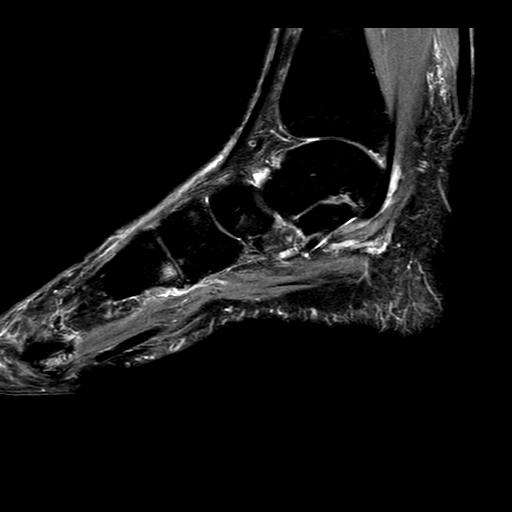
[im 30/30]
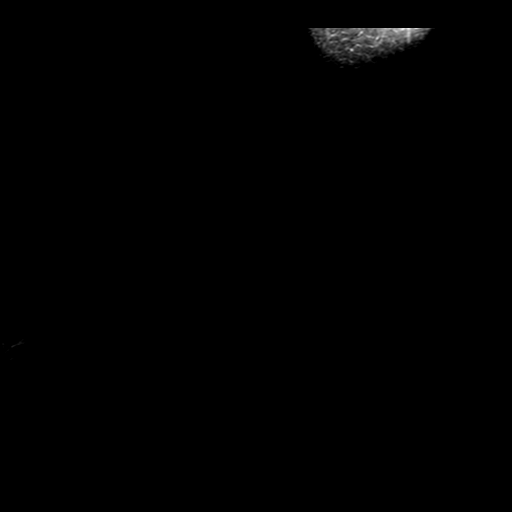

[Series 601: T1 · coronal · 3.0mm · 0.27mm/px · 8 of 56 slices shown]
[im 1/56]
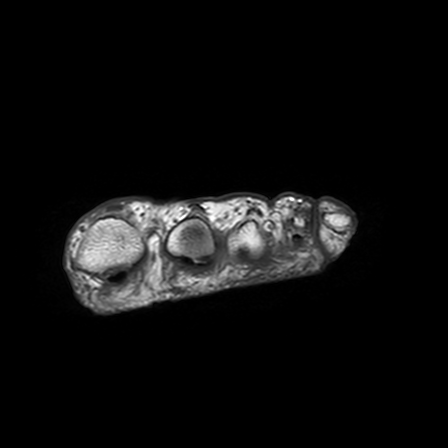
[im 7/56]
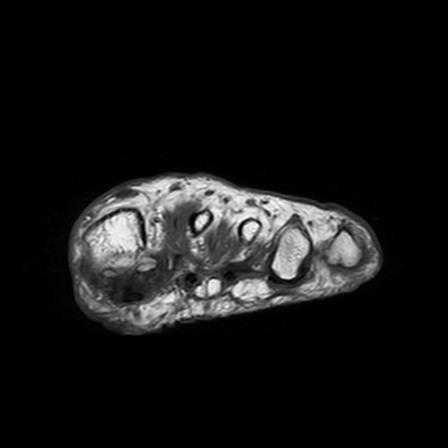
[im 19/56]
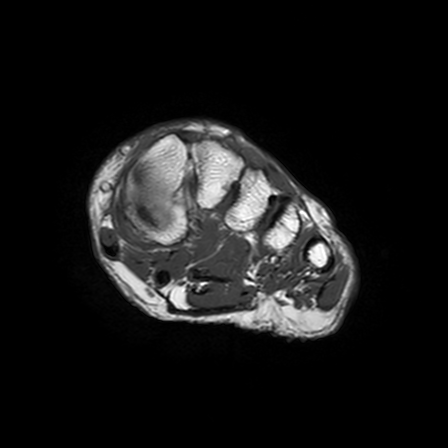
[im 25/56]
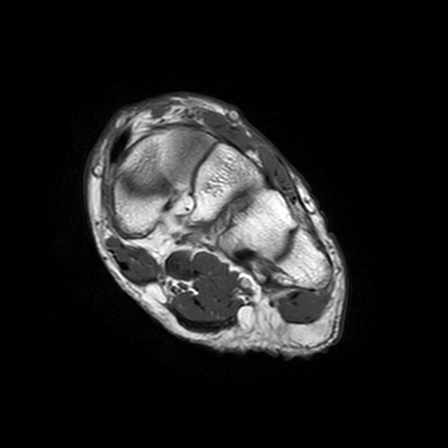
[im 31/56]
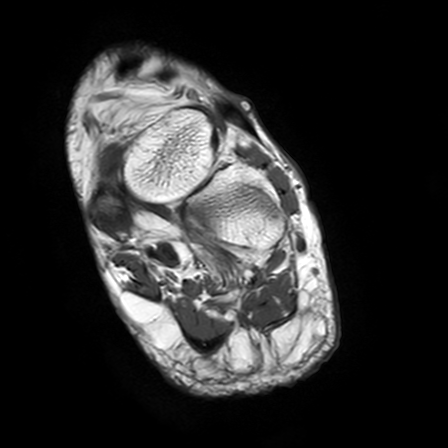
[im 37/56]
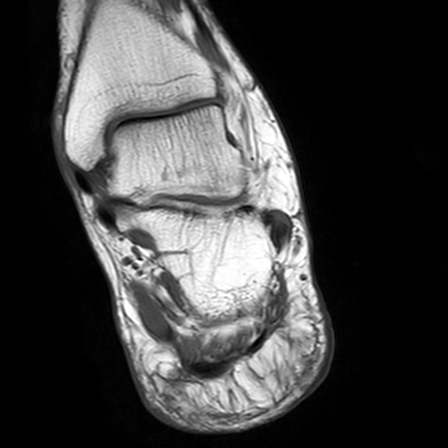
[im 49/56]
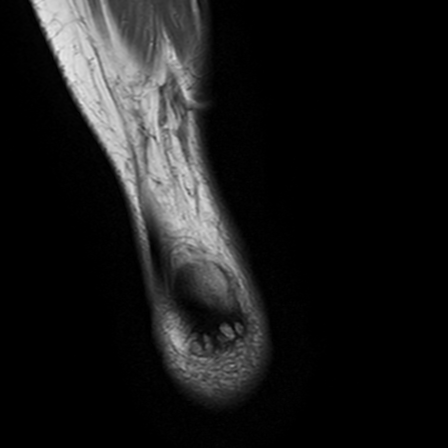
[im 56/56]
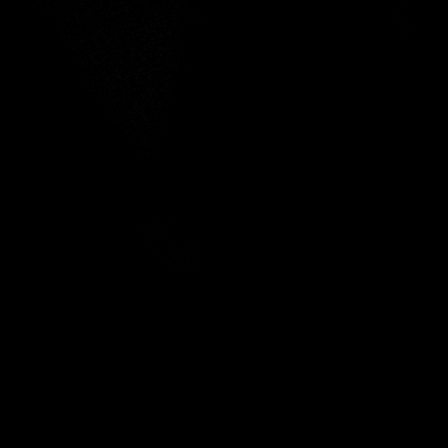

[22 of 40 positions shown; findings below may reference images not displayed]

FINDINGS: TENDONS: The included portions of the flexor, extensor and peroneal tendons are 
negative for tear or tendinosis. There is a small amount of fluid extends 
through the ankle joint within the flexor hallucis tendon sheath extending to 
the knot of [place]. The included portion of the Achilles tendon is preserved. 
LIGAMENTS: Lisfranc ligamentous complex is preserved. MCP collateral ligaments 
are preserved. Intersesamoid ligament is preserved. Ankle ligaments are 
partially included with chronic full-thickness disruption of the anterior 
talofibular ligament. This area was not included on the prior MR exam. 
BONES AND JOINTS: There are scattered areas of articular cartilaginous loss with 
subcortical edema and cystic change. This includes the distal tibia, talar and 
fibular articulation. There is articular cartilaginous loss of both the midfoot 
and forefoot with subcortical edema and cystic change including the medial 
cuneiform-first metatarsal joint and first MTP joint. No fracture. Normal 
alignment. 
MUSCLES: Musculature is symmetric without mass, signal abnormality or atrophy.  
OTHER SOFT TISSUES: There is mild scattered edema within the dorsal subcutaneous 
tissues. Plantar fascia is preserved. Sinus tarsi fat is preserved. Included 
neurovascular bundles are negative.
IMPRESSION: Mild to moderately advanced degenerative changes of the left foot. 
Chronic full-thickness disruption of the anterior talofibular ligament.

## 2022-05-11 IMAGING — CT CT ABDOMEN AND PELVIS WITH CONTRAST
2 of 3 series · 16 of 46 positions shown, 18 images · IV contrast (isovue)
Comparison: There are no prior exam(s) available for comparison within the past 
12 months;

________________________________________________________________________________________________ 
CT ABDOMEN AND PELVIS WITH CONTRAST, 05/11/2022 [DATE]: 
A search for DICOM formatted images was conducted for prior CT imaging studies 
completed at a non-affiliated media free facility.   
CLINICAL INDICATION: Epigastric pain
TECHNIQUE: The abdomen and pelvis was scanned from lung bases through the pubic 
rami with 75 mL of Isovue 300 MDV on a high-resolution CT scanner using dose 
reduction techniques.  Routine MPR reconstructions were performed. The patients 
eGFR was calculated to be 83 mL/min/1.73 m2 using the i-STAT device..

[Series 4: axial · axial · 0.73mm/px · z∈[-464,-68]mm · 13 of 152 slices shown, 15 images]
[im 10/152  soft-tissue]
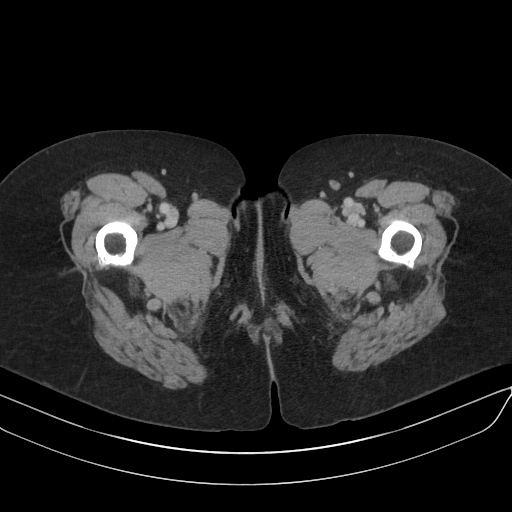
[im 10/152  bone]
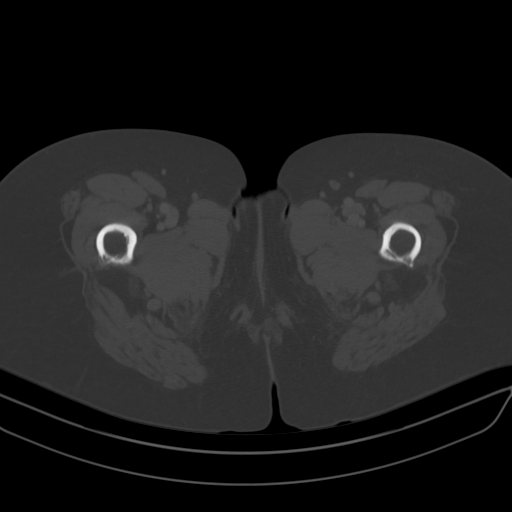
[im 20/152  soft-tissue]
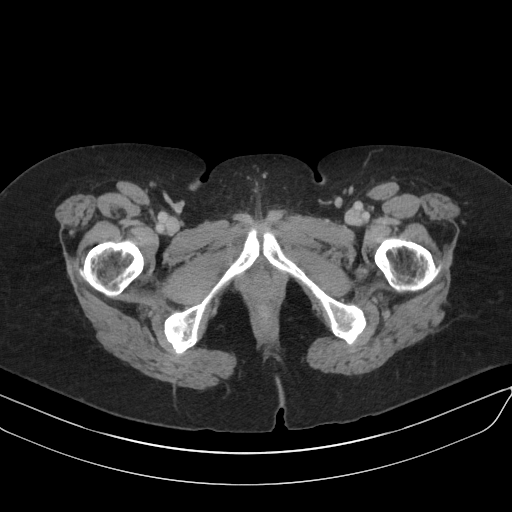
[im 30/152  soft-tissue]
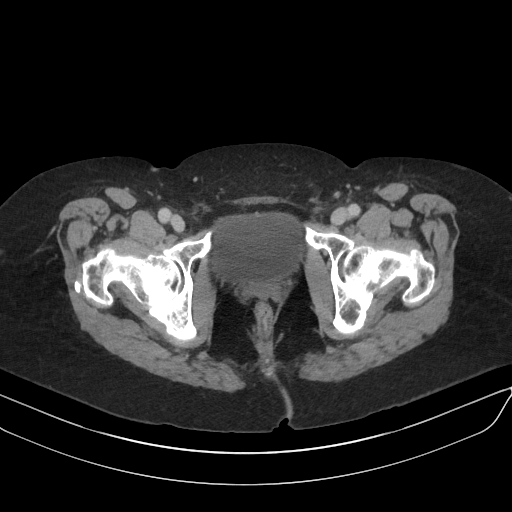
[im 44/152  soft-tissue]
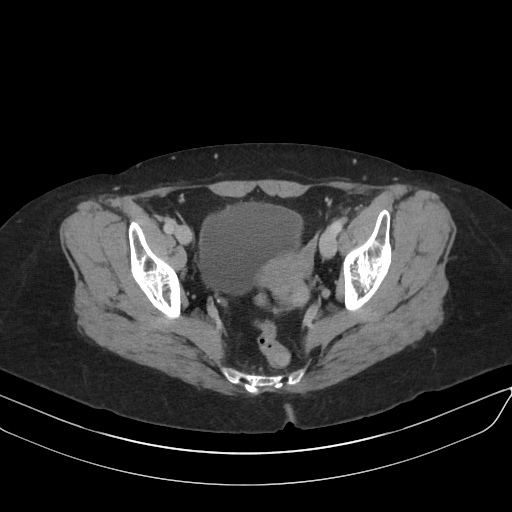
[im 54/152  soft-tissue]
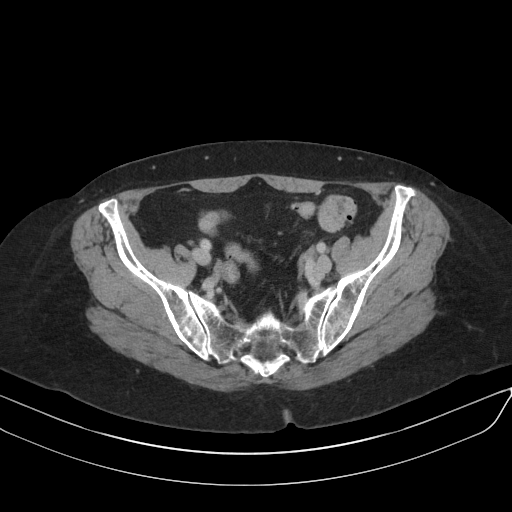
[im 64/152  soft-tissue]
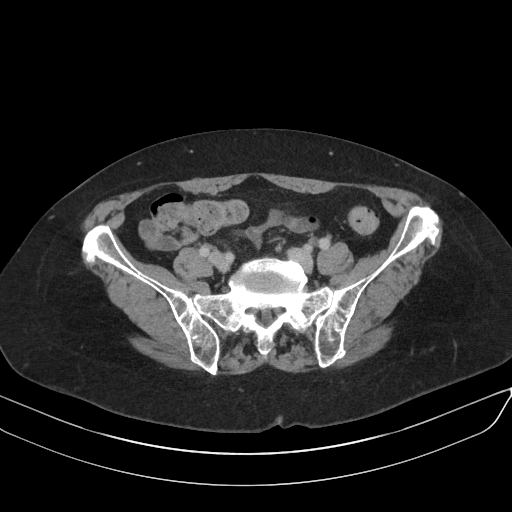
[im 78/152  soft-tissue]
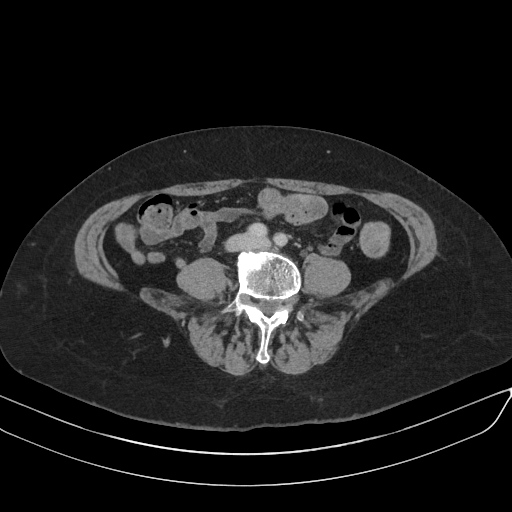
[im 88/152  soft-tissue]
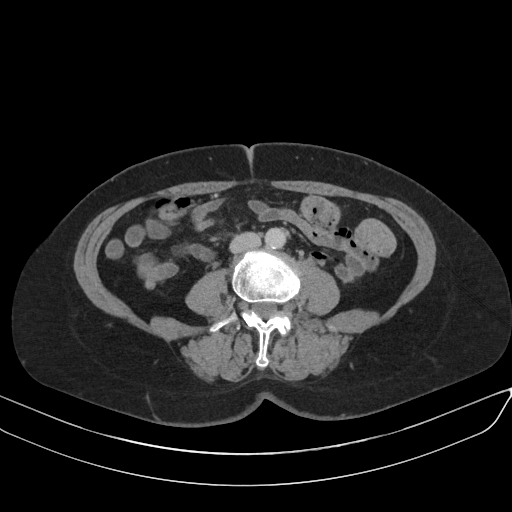
[im 98/152  soft-tissue]
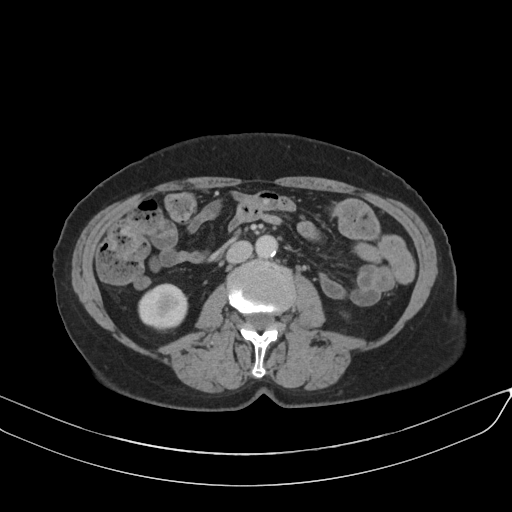
[im 98/152  bone]
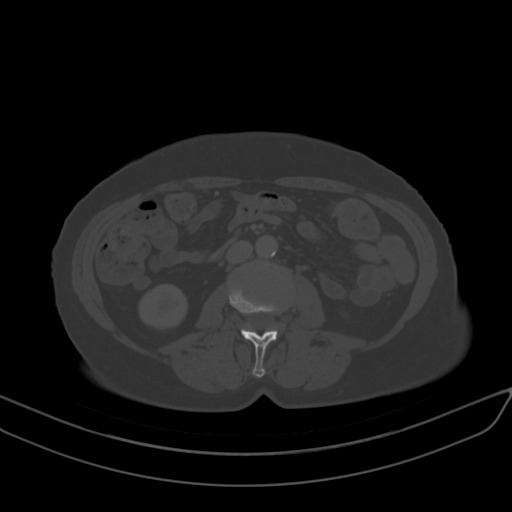
[im 108/152  soft-tissue]
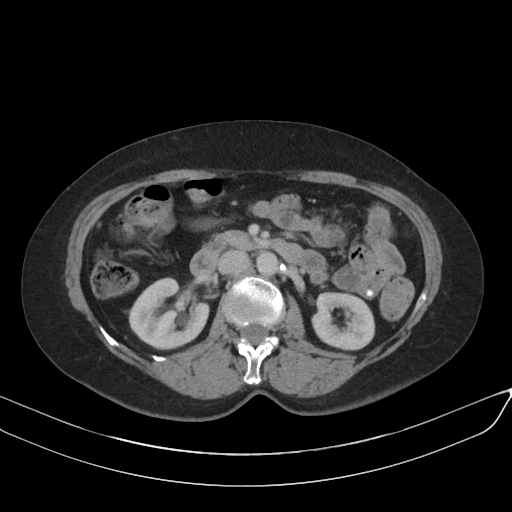
[im 122/152  soft-tissue]
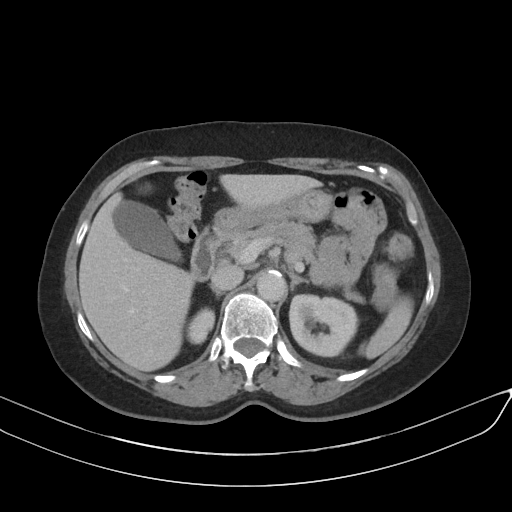
[im 132/152  soft-tissue]
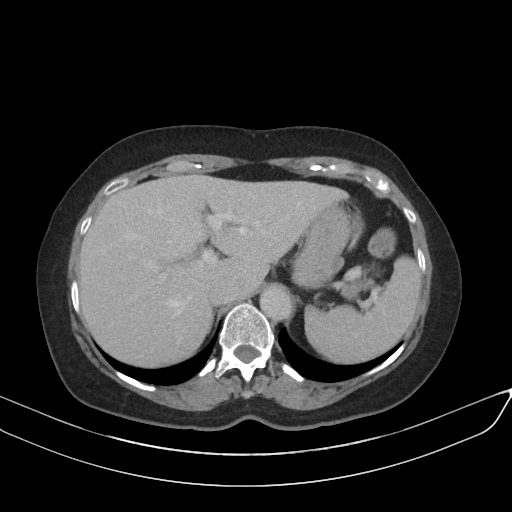
[im 142/152  soft-tissue]
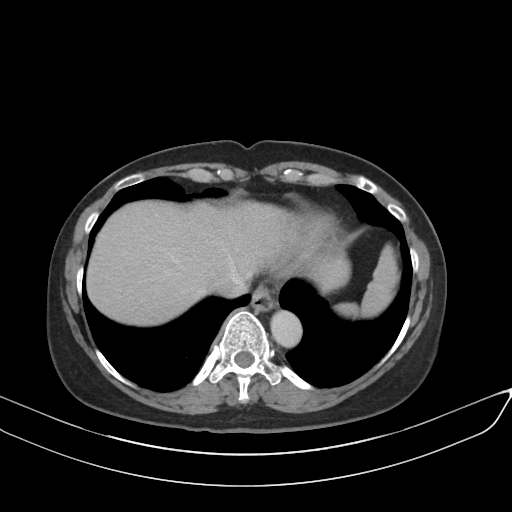

[Series 5: cor · coronal · 0.80mm/px · 3 of 108 slices shown]
[im 36/108  soft-tissue]
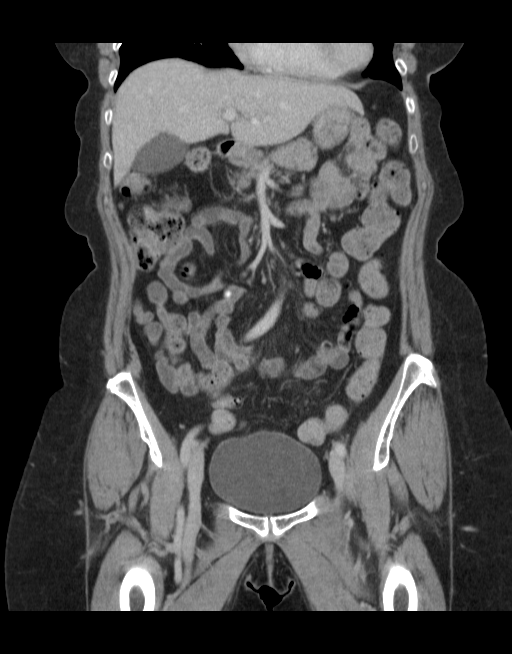
[im 48/108  soft-tissue]
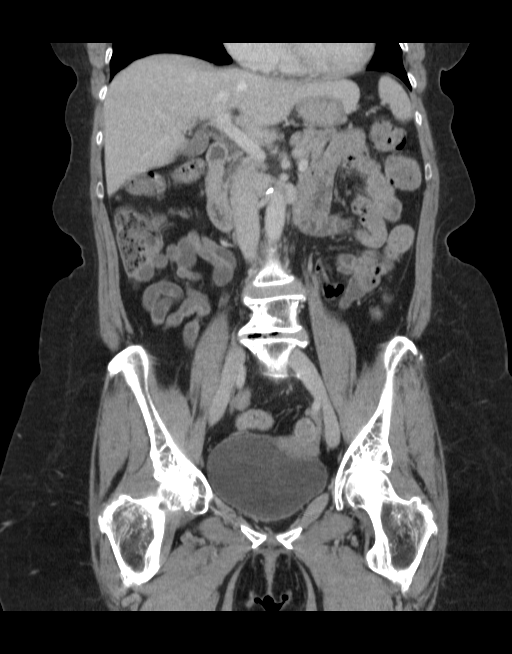
[im 60/108  soft-tissue]
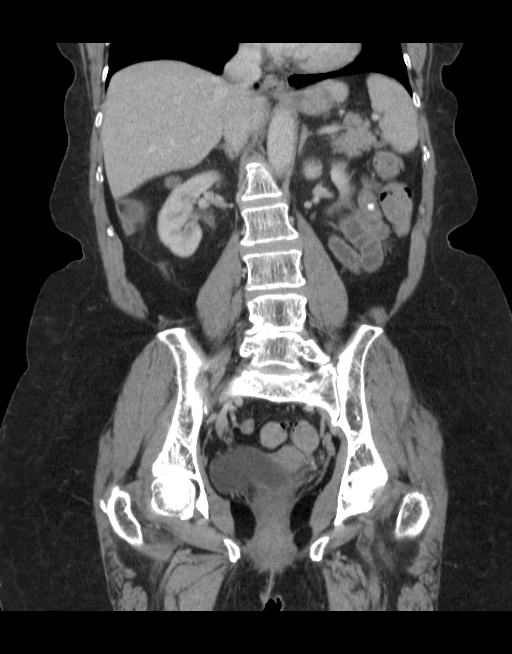

[16 of 46 positions shown; findings below may reference images not displayed]

however, comparison was made to the prior exam(s) 05/08/2020 
abdominal sonogram.
FINDINGS: LUNG BASES: Lung bases are clear. No pleural effusions. 
HEPATOBILIARY: No mass or biliary dilatation. No gallstones. 
SPLEEN: Normal in size. 
PANCREAS: No ductal dilatation or mass.   
ADRENALS: No mass. 
GENITOURINARY: No enhancing mass or hydronephrosis.  Bladder is unremarkable. 
LYMPH NODES: No adenopathy. 
STOMACH, SMALL BOWEL AND COLON: Tiny hiatal hernia. Colonic diverticulosis 
without evidence of diverticulitis. No bowel wall thickening or obstruction. The 
appendix is not identified, however, there are no right lower quadrant 
inflammatory changes. 
VASCULAR STRUCTURES: No aneurysm. Atherosclerosis. 
MUSCULOSKELETAL: No acute osseous abnormality. Scattered degenerative changes. 
Osteopenia.
IMPRESSION: 1.  Tiny hiatal hernia.  
2.  Colonic diverticulosis without evidence of diverticulitis.  
3.  Atherosclerosis. 
4.  Degenerative change and osteopenia: DXA with TBS (trabecular bone score) may 
be helpful for further evaluation. 
RADIATION DOSE REDUCTION: All CT scans are performed using radiation dose 
reduction techniques, when applicable.  Technical factors are evaluated and 
adjusted to ensure appropriate moderation of exposure.  Automated dose 
management technology is applied to adjust the radiation doses to minimize 
exposure while achieving diagnostic quality images.

## 2022-05-26 IMAGING — NM HEPATOBILIARY SCAN WITH SINCALIDE
3 series · 24 of 24 positions shown · non-contrast
Comparison: none

________________________________________________________________________________________________ 
HEPATOBILIARY SCAN WITH SINCALIDE, 05/26/2022 [DATE]: 
CLINICAL INDICATION: Right Upper Quadrant Pain.
TECHNIQUE: The patient was injected with 8.2 mCi of [place] technetium 99m Choletec. 
Imaging of the right upper quadrant were then performed. 1.3 mcg of Kinevac was 
injected intravenously and a stimulated gallbladder ejection fraction was 
performed.

[Series 1000: hida 0-(id) · 3.90mm/px · 6 of 60 frames shown (1 of 2)]
[frame 6/60]
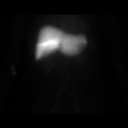
[frame 16/60]
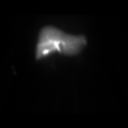
[frame 26/60]
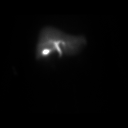
[frame 36/60]
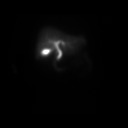
[frame 46/60]
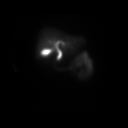
[frame 56/60]
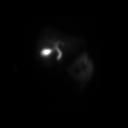

[Series 1000: hida 0-(id) · 1.95mm/px · 12 of 12 slices shown (2 of 2)]
[im 1/12]
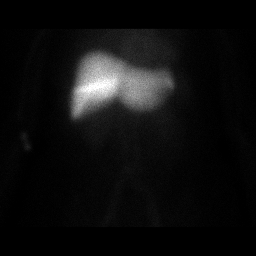
[im 2/12]
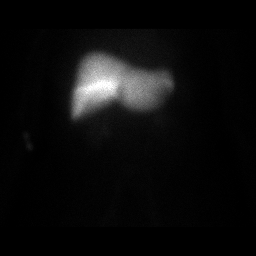
[im 3/12]
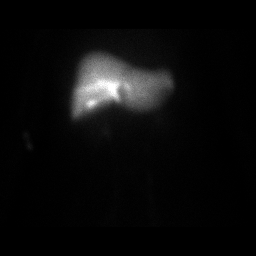
[im 4/12]
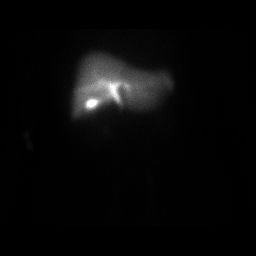
[im 5/12]
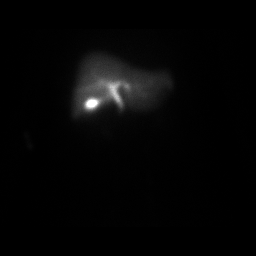
[im 6/12]
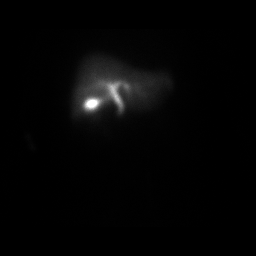
[im 7/12]
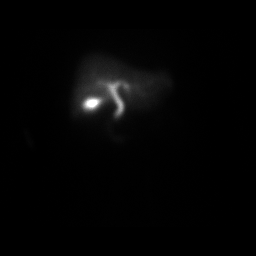
[im 8/12]
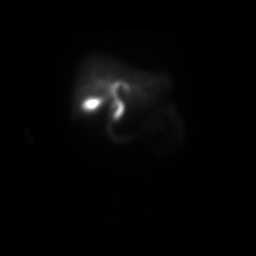
[im 9/12]
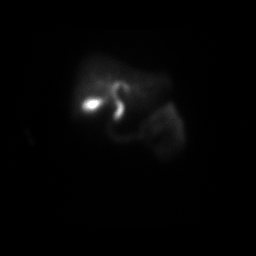
[im 10/12]
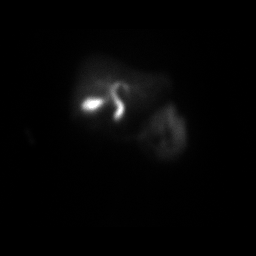
[im 11/12]
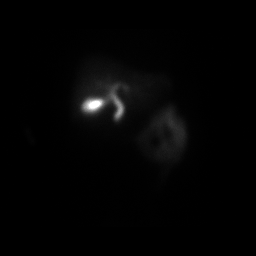
[im 12/12]
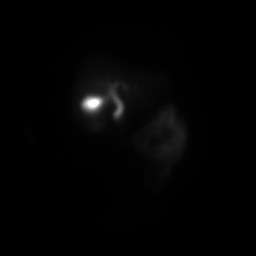

[Series 1000: hepatobiliary ef · 3.90mm/px · 6 of 30 frames shown]
[frame 3/30]
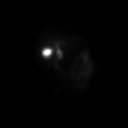
[frame 8/30]
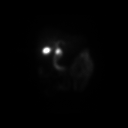
[frame 13/30]
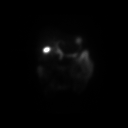
[frame 18/30]
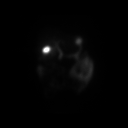
[frame 23/30]
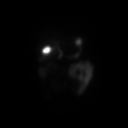
[frame 28/30]
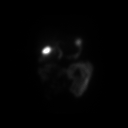

[24 of 24 positions shown; findings below may reference images not displayed]

FINDINGS: There is physiologic distribution radiotracer throughout the liver 
with prompt excretion into the biliary tree, gallbladder and bowel. Gallbladder 
ejection fractions are: 20 minutes 37%, 30 minutes 27%.
IMPRESSION: Borderline normal gallbladder ejection fraction of 37% at 20 minutes. (Normal 
for this lab 35% at 20 minutes).
